# Patient Record
Sex: Male | Born: 1960 | Race: Black or African American | Hispanic: No | Marital: Single | Smoking: Former smoker
Health system: Southern US, Community
[De-identification: ages and names within clinical notes are randomized; demographics above are authoritative.]

## PROBLEM LIST (undated history)

## (undated) DIAGNOSIS — K922 Gastrointestinal hemorrhage, unspecified: Secondary | ICD-10-CM

## (undated) DIAGNOSIS — K219 Gastro-esophageal reflux disease without esophagitis: Secondary | ICD-10-CM

## (undated) DIAGNOSIS — Z5189 Encounter for other specified aftercare: Secondary | ICD-10-CM

## (undated) DIAGNOSIS — K7460 Unspecified cirrhosis of liver: Secondary | ICD-10-CM

## (undated) DIAGNOSIS — E119 Type 2 diabetes mellitus without complications: Secondary | ICD-10-CM

## (undated) DIAGNOSIS — B1920 Unspecified viral hepatitis C without hepatic coma: Secondary | ICD-10-CM

## (undated) HISTORY — PX: HERNIA REPAIR: SHX51

## (undated) HISTORY — PX: MANDIBLE SURGERY: SHX707

---

## 2009-07-07 ENCOUNTER — Emergency Department (HOSPITAL_COMMUNITY): Admission: EM | Admit: 2009-07-07 | Discharge: 2009-07-07 | Payer: Self-pay | Admitting: Emergency Medicine

## 2009-07-31 ENCOUNTER — Ambulatory Visit: Payer: Self-pay | Admitting: Gastroenterology

## 2009-07-31 ENCOUNTER — Inpatient Hospital Stay (HOSPITAL_COMMUNITY)
Admission: EM | Admit: 2009-07-31 | Discharge: 2009-08-02 | Payer: Self-pay | Source: Home / Self Care | Admitting: Emergency Medicine

## 2009-08-19 ENCOUNTER — Encounter (INDEPENDENT_AMBULATORY_CARE_PROVIDER_SITE_OTHER): Payer: Self-pay | Admitting: Emergency Medicine

## 2009-08-19 ENCOUNTER — Ambulatory Visit: Payer: Self-pay | Admitting: Vascular Surgery

## 2009-08-19 ENCOUNTER — Inpatient Hospital Stay (HOSPITAL_COMMUNITY)
Admission: EM | Admit: 2009-08-19 | Discharge: 2009-08-21 | Payer: Self-pay | Source: Home / Self Care | Admitting: Emergency Medicine

## 2009-09-28 ENCOUNTER — Emergency Department (HOSPITAL_COMMUNITY): Admission: EM | Admit: 2009-09-28 | Discharge: 2009-09-28 | Payer: Self-pay | Admitting: Emergency Medicine

## 2009-12-17 ENCOUNTER — Inpatient Hospital Stay (HOSPITAL_COMMUNITY)
Admission: EM | Admit: 2009-12-17 | Discharge: 2009-12-21 | Payer: Self-pay | Source: Home / Self Care | Admitting: Emergency Medicine

## 2009-12-19 ENCOUNTER — Ambulatory Visit: Payer: Self-pay | Admitting: Internal Medicine

## 2009-12-20 ENCOUNTER — Encounter: Payer: Self-pay | Admitting: Internal Medicine

## 2010-01-06 ENCOUNTER — Inpatient Hospital Stay (HOSPITAL_COMMUNITY)
Admission: EM | Admit: 2010-01-06 | Discharge: 2010-01-12 | Payer: Self-pay | Source: Home / Self Care | Attending: Internal Medicine | Admitting: Internal Medicine

## 2010-01-07 ENCOUNTER — Encounter: Payer: Self-pay | Admitting: Gastroenterology

## 2010-01-10 ENCOUNTER — Telehealth (INDEPENDENT_AMBULATORY_CARE_PROVIDER_SITE_OTHER): Payer: Self-pay | Admitting: *Deleted

## 2010-01-12 ENCOUNTER — Telehealth (INDEPENDENT_AMBULATORY_CARE_PROVIDER_SITE_OTHER): Payer: Self-pay

## 2010-01-12 ENCOUNTER — Encounter: Payer: Self-pay | Admitting: Gastroenterology

## 2010-01-25 ENCOUNTER — Ambulatory Visit (HOSPITAL_COMMUNITY)
Admission: RE | Admit: 2010-01-25 | Discharge: 2010-01-25 | Payer: Self-pay | Source: Home / Self Care | Attending: Gastroenterology | Admitting: Gastroenterology

## 2010-01-25 ENCOUNTER — Encounter: Payer: Self-pay | Admitting: Gastroenterology

## 2010-02-21 NOTE — Procedures (Signed)
Summary: Upper Endoscopy  Patient: Jesse Delacruz Note: All result statuses are Final unless otherwise noted.  Tests: (1) Upper Endoscopy (EGD)   EGD Upper Endoscopy       DONE     [facility]     [address]     [place], [place]  [address]           ENDOSCOPY PROCEDURE REPORT           PATIENT:  [name], [name]  MR#:  [id number]     BIRTHDATE:  [birth year], 49 yrs. old  GENDER:  male     ENDOSCOPIST:  Judie Petit T. Russella Dar, MD, [facility]     Referred by:  [facility],     PROCEDURE DATE:  07/31/2009     PROCEDURE:  EGD, diagnostic     ASA CLASS:  Class III     INDICATIONS:  hematemesis     MEDICATIONS:  Fentanyl 40 mcg IV, Versed 4 mg IV     TOPICAL ANESTHETIC:  Cetacaine Spray     DESCRIPTION OF PROCEDURE:   After the risks benefits and     alternatives of the procedure were thoroughly explained, informed     consent was obtained.  The Pentax EG-[id number] 3.2 [id number] endoscope     was introduced through the mouth and advanced to the second     portion of the duodenum, without limitations.  The instrument was     slowly withdrawn as the mucosa was fully examined.     <<PROCEDUREIMAGES>>     Gastropathy was found in the total stomach. It was erythematous,     granular and non-bleeding. The esophagus and gastroesophageal     junction were completely normal in appearance. No esophageal or     gastric varices. The duodenal bulb was normal in appearance, as     was the postbulbar duodenum.  Retroflexed views revealed     Retroflexion exam demonstrated findings as previously described.     The scope was then withdrawn from the patient and the procedure     completed.           COMPLICATIONS:  None           ENDOSCOPIC IMPRESSION:     1) Portal gastropathy           RECOMMENDATIONS:     1) consider nonselective B Blocker therapy to reduce portal     pressures when stablized     2) return to referring MD and PCP for ongoing medical care           Malcolm  T. Russella Dar, MD, [facility]           n.     eSIGNED:   Venita Lick. Stark at 07/31/2009 05:19 PM           Sheets, Lazy Y U, [id number]  Note: An exclamation mark (!) indicates a result that was not dispersed into the flowsheet. Document Creation Date: 08/01/2009 3:15 PM _______________________________________________________________________  (1) Order result status: Final Collection or observation date-time: 07/31/2009 16:42 Requested date-time:  Receipt date-time:  Reported date-time:  Referring Physician:   Ordering Physician: Claudette Head [phone]) Specimen Source:  Source: Launa Grill Order Number: [id number] Lab site:

## 2010-02-21 NOTE — Procedures (Signed)
Summary: Upper Endoscopy  Patient: Jesse Delacruz Note: All result statuses are Final unless otherwise noted.  Tests: (1) Upper Endoscopy (EGD)   EGD Upper Endoscopy       DONE     [facility]     [address]     [place], [place]  [address]           ENDOSCOPY PROCEDURE REPORT           PATIENT:  Jesse Delacruz, Jesse Delacruz  MR#:  [id number]     BIRTHDATE:  29-Nov-1960, 49 yrs. 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  The Pentax Gastroscope [id number] endoscope     was introduced through the mouth and advanced to the second     portion of the duodenum, without limitations.  The instrument was     slowly withdrawn as the mucosa was fully examined.     <<PROCEDUREIMAGES>>           Portal gastropathy in the total stomach. Severe in mid-stomach.     Otherwise the examination was normal. No varices seen in the     esophagus. Photos not captured.    Retroflexed views revealed     Retroflexion exam demonstrated findings as previously described.     The scope was then withdrawn from the patient and the procedure     completed.           COMPLICATIONS:  None           ENDOSCOPIC IMPRESSION:     1) Portal gastropathy in the total stomach     2) Otherwise normal examination - no esophageal varices     identified     RECOMMENDATIONS:     1) Change propranolol from 20  mg qd to Nadolol 20 mg qd.     2) Stop alcohol     3) Needs an MRI of liver in 6 mos due to 11 mm indeterminate     lesion (? hepatocellular carcinoma).     4) will check alpha fetoprotein, if elevated significantly then     can indicate HCC           Iva Boop, MD, [facility] Graham           CC:  Clyda Greener, MD           n.     eSIGNED:   Iva Boop at 12/20/2009 02:30 PM           [name],  [name], [name] [id number]  Note: An exclamation mark (!) indicates a result that was not dispersed into the flowsheet. Document Creation Date: 12/20/2009 2:30 PM _______________________________________________________________________  (1) Order result status: Final Collection or observation date-time: 12/20/2009 14:23 Requested date-time:  Receipt date-time:  Reported date-time:  Referring Physician:   Ordering Physician: Stan Head [phone]) Specimen Source:  Source: Launa Grill Order Number: [id number] Lab site:

## 2010-02-23 NOTE — Procedures (Signed)
Summary: Upper Endoscopy  Patient: [name] Note: All result statuses are Final unless otherwise noted.  Tests: (1) Upper Endoscopy (EGD)   EGD Upper Endoscopy       DONE     [facility]     [address] [place], [place]  [address]           ENDOSCOPY PROCEDURE REPORT           PATIENT:  Jesse Delacruz, Jesse Delacruz  MR#:  [id number]     BIRTHDATE:  02-Oct-1960, 49 yrs. old  GENDER:  male     ENDOSCOPIST:  Rachael Fee, MD     PROCEDURE DATE:  01/07/2010     PROCEDURE:  EGD with band ligation of varices     ASA CLASS:  Class IV     INDICATIONS:  melena, known cirrhosis; EGD 3-4 weeks ago found     varices, portal gastropathy     MEDICATIONS:   Fentanyl 75 mcg IV, Versed 6 mg IV     TOPICAL ANESTHETIC:  Cetacaine Spray     DESCRIPTION OF PROCEDURE:   After the risks benefits and     alternatives of the procedure were thoroughly explained, informed     consent was obtained.  The  endoscope was introduced through the     mouth and advanced to the second portion of the duodenum, without     limitations.  The instrument was slowly withdrawn as the mucosa     was fully examined.     <<PROCEDUREIMAGES>>     There were three trunks of medium sized distal esophagus varices.     No active bleeding and no redspots or fibrin clots on varices.     These were treated with placement of 6 ligating bands. There was     no blood in stomach. There was moderate changes of portal     gastropathy throughout stomach (see image2, image10, image5, and     image6).  Otherwise the examination was normal (see image4).     Retroflexed views revealed no abnormalities.    The scope was then     withdrawn from the patient and the procedure completed.     COMPLICATIONS:  None           ENDOSCOPIC IMPRESSION:     1) Medium sized esophageal varices, none bleeding. These were     treated with band ligation     2) Portal gastropathy.     2) Otherwise normal examination; no gastric varices         RECOMMENDATIONS:     Continue on octreotide gtts another 24 hours.     Observe clinically for rebleeding, DTs.     Clear liquids today.           ______________________________     Rachael Fee, MD           cc: Stan Head, MD           n.     eSIGNED:   Rachael Fee at 01/07/2010 09:42 AM           Miggins, Rella Larve, [id number]  Note: An exclamation mark (!) indicates a result that was not dispersed into the flowsheet. Document Creation Date: 01/09/2010 8:45 AM _______________________________________________________________________  (1) Order result status: Final Collection or observation date-time: 01/07/2010 09:36 Requested date-time:  Receipt date-time:  Reported date-time:  Referring Physician:   Ordering Physician: Rob Bunting [phone]) Specimen Source:  Source: Launa Grill Order Number: [id number] Lab site:

## 2010-02-23 NOTE — Letter (Signed)
Summary: EGD Instructions  [facility]  [address] [place], [place] [address]   Phone: [phone]  Fax: [phone]       [facility]    [birth year]    MRN: [id number]       Procedure Day Dorna Bloom: Wednesday 01-25-10       Arrival Time: 7:30 a.m.     Procedure Time: 8:30 a.m.     Location of Procedure:                     _x  _ [facility] ( Outpatient Registration)    PREPARATION FOR ENDOSCOPY   On 01/24/10 THE DAY OF THE PROCEDURE:  1.   No solid foods, milk or milk products are allowed after midnight the night before your procedure.  2.   Do not drink anything colored red or purple.  Avoid juices with pulp.  No orange juice.  3.  You may drink clear liquids until 4:30 a.m.  , which is 4 hours before your procedure.                                                                                                CLEAR LIQUIDS INCLUDE: Water Jello Ice Popsicles Tea (sugar ok, no milk/cream) Powdered fruit flavored drinks Coffee (sugar ok, no milk/cream) Gatorade Juice: apple, white grape, white cranberry  Lemonade Clear bullion, consomm, broth Carbonated beverages (any kind) Strained chicken noodle soup Hard Candy   MEDICATION INSTRUCTIONS  Unless otherwise instructed, you should take regular prescription medications with a small sip of water as early as possible the morning of your procedure.  Diabetic patients - see separate instructions.   Additional medication instructions: _             OTHER INSTRUCTIONS  You will need a responsible adult at least 51 years of age to accompany you and drive you home.   This person must remain in the waiting room during your procedure.  Wear loose fitting clothing that is easily removed.  Leave jewelry and other valuables at home.  However, you may wish to bring a book to read or an iPod/MP3 player to listen to music as you wait for your procedure to start.  Remove all body piercing  jewelry and leave at home.  Total time from sign-in until discharge is approximately 2-3 hours.  You should go home directly after your procedure and rest.  You can resume normal activities the day after your procedure.  The day of your procedure you should not:   Drive   Make legal decisions   Operate machinery   Drink alcohol   Return to work  You will receive specific instructions about eating, activities and medications before you leave.    The above instructions have been reviewed and explained to me by   _______________________    I fully understand and can verbalize these instructions _____________________________ Date _________

## 2010-02-23 NOTE — Progress Notes (Signed)
Summary: EGD  Phone Note Other Incoming   Caller: Amy Summary of Call: Dr Christella Hartigan  Amy called and would like you to do a EGD with banding at [facility] 01/26/10  you cc'd Dr Leone Payor on the last EGD should this go to Dr Leone Payor? Initial call taken by: Chales Abrahams CMA Duncan Dull),  January 10, 2010 10:52 AM  Follow-up for Phone Call        yes, saw patient last month in hosp. Follow-up by: Rachael Fee MD,  January 10, 2010 11:11 AM  Additional Follow-up for Phone Call Additional follow up Details #1::        left message on Amy's  machine to call back  Additional Follow-up by: Chales Abrahams CMA Duncan Dull),  January 10, 2010 11:21 AM    Additional Follow-up for Phone Call Additional follow up Details #2::    Amy aware Follow-up by: Chales Abrahams CMA Duncan Dull),  January 10, 2010 12:50 PM

## 2010-02-23 NOTE — Procedures (Signed)
Summary: Upper Endoscopy  Patient: Jesse Delacruz Note: All result statuses are Final unless otherwise noted.  Tests: (1) Upper Endoscopy (EGD)   EGD Upper Endoscopy       DONE     [facility]     [address] [place], [place]  [address]           ENDOSCOPY PROCEDURE REPORT           PATIENT:  Jesse, Delacruz  MR#:  [id number]     BIRTHDATE:  02/23/1960, 49 yrs. old  GENDER:  male     ENDOSCOPIST:  Judie Petit T. Russella Dar, MD, [facility]           PROCEDURE DATE:  01/25/2010     PROCEDURE:  EGD, diagnostic 43235     ASA CLASS:  Class III     INDICATIONS:  follow up of esophageal varices     MEDICATIONS:  Fentanyl 50 mcg IV, Versed 5 mg IV     TOPICAL ANESTHETIC:  Cetacaine Spray     DESCRIPTION OF PROCEDURE:   After the risks benefits and     alternatives of the procedure were thoroughly explained, informed     consent was obtained.  The EG-[id number] ([id number]) endoscope was     introduced through the mouth and advanced to the second portion of     the duodenum, without limitations.  The instrument was slowly     withdrawn as the mucosa was fully examined.     <<PROCEDUREIMAGES>>     Multiple ulcers were found in the distal esophagus at the sites of     prior bands. No varices noted.  Gastropathy was found in the total     stomach.  The duodenal bulb was normal in appearance, as was the     postbulbar duodenum.  Otherwise the examination was normal.     Retroflexed views revealed no abnormalities and no gastric varices     seen upon retroflexion into the cardia. The scope was then     withdrawn from the patient and the procedure completed.           COMPLICATIONS:  None           ENDOSCOPIC IMPRESSION:     1) Ulcers, multiple in the distal esophagus     2) Gastropathy           RECOMMENDATIONS:     1) EGD in one year     2) Follow up with PCP as planned           Malcolm T. Russella Dar, MD, [facility]           n.     eSIGNED:   Venita Lick. Stark at 01/25/2010 09:16 AM       Denardo, Rella Larve, [id number]  Note: An exclamation mark (!) indicates a result that was not dispersed into the flowsheet. Document Creation Date: 01/25/2010 9:16 AM _______________________________________________________________________  (1) Order result status: Final Collection or observation date-time: 01/25/2010 09:05 Requested date-time:  Receipt date-time:  Reported date-time:  Referring Physician:   Ordering Physician: Claudette Head [phone]) Specimen Source:  Source: Launa Grill Order Number: [id number] Lab site:   Appended Document: Upper Endoscopy    Clinical Lists Changes  Observations: Added new observation of EGD DUE: 01/2011 (01/25/2010 16:04)

## 2010-02-23 NOTE — Progress Notes (Signed)
Summary: Schedule EGD with Banding for Dr Russella Dar  Phone Note Other Incoming   Caller: Mike Gip, PA Summary of Call: Patient is inpatient and Mike Gip, PA asked me to set patient up for EGD with banding at [facility] .  He is scheduled for 01/25/10 8:30.  I have faxed instructions to [facility] PA and she will give them to the patient prior to discharge. Initial call taken by: Darcey Nora RN, CGRN,  January 12, 2010 9:59 AM  Follow-up for Phone Call        Above noted. Follow-up by: Meryl Dare MD Clementeen Graham,  January 12, 2010 10:03 AM

## 2010-04-03 LAB — COMPREHENSIVE METABOLIC PANEL
ALT: 57 U/L — ABNORMAL HIGH (ref 0–53)
AST: 121 U/L — ABNORMAL HIGH (ref 0–37)
Albumin: 2.1 g/dL — ABNORMAL LOW (ref 3.5–5.2)
Albumin: 2.1 g/dL — ABNORMAL LOW (ref 3.5–5.2)
Alkaline Phosphatase: 60 U/L (ref 39–117)
Alkaline Phosphatase: 60 U/L (ref 39–117)
BUN: 18 mg/dL (ref 6–23)
BUN: 5 mg/dL — ABNORMAL LOW (ref 6–23)
BUN: 7 mg/dL (ref 6–23)
CO2: 23 mEq/L (ref 19–32)
CO2: 25 mEq/L (ref 19–32)
Calcium: 8.5 mg/dL (ref 8.4–10.5)
Calcium: 8.5 mg/dL (ref 8.4–10.5)
Chloride: 101 mEq/L (ref 96–112)
Creatinine, Ser: 1.18 mg/dL (ref 0.4–1.5)
Creatinine, Ser: 1.25 mg/dL (ref 0.4–1.5)
GFR calc Af Amer: >60 mL/min (ref >60)
GFR calc Af Amer: >60 mL/min (ref >60)
GFR calc non Af Amer: >60 mL/min (ref >60)
GFR calc non Af Amer: >60 mL/min (ref >60)
Glucose, Bld: 106 mg/dL — ABNORMAL HIGH (ref 70–99)
Glucose, Bld: 122 mg/dL — ABNORMAL HIGH (ref 70–99)
Potassium: 3.4 mEq/L — ABNORMAL LOW (ref 3.5–5.1)
Sodium: 132 mEq/L — ABNORMAL LOW (ref 135–145)
Sodium: 139 mEq/L (ref 135–145)
Total Bilirubin: 2.6 mg/dL — ABNORMAL HIGH (ref 0.3–1.2)
Total Bilirubin: 3.1 mg/dL — ABNORMAL HIGH (ref 0.3–1.2)
Total Protein: 7.1 g/dL (ref 6.0–8.3)
Total Protein: 7.5 g/dL (ref 6.0–8.3)

## 2010-04-03 LAB — CBC
HCT: 25.2 % — ABNORMAL LOW (ref 39.0–52.0)
HCT: 28.4 % — ABNORMAL LOW (ref 39.0–52.0)
HCT: 31.7 % — ABNORMAL LOW (ref 39.0–52.0)
Hemoglobin: 9.3 g/dL — ABNORMAL LOW (ref 13.0–17.0)
Hemoglobin: 9.3 g/dL — ABNORMAL LOW (ref 13.0–17.0)
Hemoglobin: 9.7 g/dL — ABNORMAL LOW (ref 13.0–17.0)
MCH: 22.1 pg — ABNORMAL LOW (ref 26.0–34.0)
MCH: 23 pg — ABNORMAL LOW (ref 26.0–34.0)
MCH: 23.3 pg — ABNORMAL LOW (ref 26.0–34.0)
MCH: 23.6 pg — ABNORMAL LOW (ref 26.0–34.0)
MCHC: 29.8 g/dL — ABNORMAL LOW (ref 30.0–36.0)
MCHC: 30.4 g/dL (ref 30.0–36.0)
MCHC: 30.6 g/dL (ref 30.0–36.0)
MCHC: 30.6 g/dL (ref 30.0–36.0)
MCHC: 30.7 g/dL (ref 30.0–36.0)
MCV: 74.3 fL — ABNORMAL LOW (ref 78.0–100.0)
MCV: 74.6 fL — ABNORMAL LOW (ref 78.0–100.0)
MCV: 75.5 fL — ABNORMAL LOW (ref 78.0–100.0)
MCV: 76 fL — ABNORMAL LOW (ref 78.0–100.0)
MCV: 76.7 fL — ABNORMAL LOW (ref 78.0–100.0)
MCV: 76.8 fL — ABNORMAL LOW (ref 78.0–100.0)
Platelets: 46 10*3/uL — ABNORMAL LOW (ref 150–400)
Platelets: 47 10*3/uL — ABNORMAL LOW (ref 150–400)
Platelets: 48 10*3/uL — ABNORMAL LOW (ref 150–400)
Platelets: 58 10*3/uL — ABNORMAL LOW (ref 150–400)
RBC: 4.04 MIL/uL — ABNORMAL LOW (ref 4.22–5.81)
RDW: 20.8 % — ABNORMAL HIGH (ref 11.5–15.5)
RDW: 20.8 % — ABNORMAL HIGH (ref 11.5–15.5)
RDW: 20.9 % — ABNORMAL HIGH (ref 11.5–15.5)
RDW: 20.9 % — ABNORMAL HIGH (ref 11.5–15.5)
RDW: 21 % — ABNORMAL HIGH (ref 11.5–15.5)
RDW: 21 % — ABNORMAL HIGH (ref 11.5–15.5)
WBC: 4.2 10*3/uL (ref 4.0–10.5)
WBC: 4.9 10*3/uL (ref 4.0–10.5)
WBC: 5.1 10*3/uL (ref 4.0–10.5)

## 2010-04-03 LAB — TYPE AND SCREEN
Unit division: 0
Unit division: 0

## 2010-04-03 LAB — BASIC METABOLIC PANEL
BUN: 10 mg/dL (ref 6–23)
BUN: 13 mg/dL (ref 6–23)
BUN: 22 mg/dL (ref 6–23)
Calcium: 8.2 mg/dL — ABNORMAL LOW (ref 8.4–10.5)
Calcium: 8.6 mg/dL (ref 8.4–10.5)
Chloride: 103 mEq/L (ref 96–112)
Chloride: 106 mEq/L (ref 96–112)
Creatinine, Ser: 1.1 mg/dL (ref 0.4–1.5)
GFR calc non Af Amer: >60 mL/min (ref >60)
Glucose, Bld: 110 mg/dL — ABNORMAL HIGH (ref 70–99)
Glucose, Bld: 124 mg/dL — ABNORMAL HIGH (ref 70–99)
Potassium: 4.2 mEq/L (ref 3.5–5.1)
Sodium: 133 mEq/L — ABNORMAL LOW (ref 135–145)

## 2010-04-03 LAB — APTT: aPTT: 41 seconds — ABNORMAL HIGH (ref 24–37)

## 2010-04-03 LAB — DIFFERENTIAL
Basophils Relative: 1 % (ref 0–1)
Basophils Relative: 2 % — ABNORMAL HIGH (ref 0–1)
Eosinophils Absolute: 0.2 10*3/uL (ref 0.0–0.7)
Eosinophils Relative: 3 % (ref 0–5)
Monocytes Absolute: 1.7 10*3/uL — ABNORMAL HIGH (ref 0.1–1.0)
Monocytes Relative: 20 % — ABNORMAL HIGH (ref 3–12)
Neutro Abs: 4.6 10*3/uL (ref 1.7–7.7)
Neutrophils Relative %: 61 % (ref 43–77)

## 2010-04-03 LAB — RAPID URINE DRUG SCREEN, HOSP PERFORMED
Benzodiazepines: NOT DETECTED
Cocaine: NOT DETECTED
Opiates: NOT DETECTED
Tetrahydrocannabinol: NOT DETECTED

## 2010-04-03 LAB — PROTIME-INR
INR: 1.5 — ABNORMAL HIGH (ref 0.00–1.49)
INR: 1.51 — ABNORMAL HIGH (ref 0.00–1.49)
Prothrombin Time: 18.4 seconds — ABNORMAL HIGH (ref 11.6–15.2)

## 2010-04-03 LAB — AMMONIA
Ammonia: 58 umol/L — ABNORMAL HIGH (ref 11–35)
Ammonia: 67 umol/L — ABNORMAL HIGH (ref 11–35)

## 2010-04-03 LAB — HEPATIC FUNCTION PANEL
ALT: 55 U/L — ABNORMAL HIGH (ref 0–53)
Albumin: 2.2 g/dL — ABNORMAL LOW (ref 3.5–5.2)
Alkaline Phosphatase: 71 U/L (ref 39–117)
Indirect Bilirubin: 1.6 mg/dL — ABNORMAL HIGH (ref 0.3–0.9)
Total Protein: 7.8 g/dL (ref 6.0–8.3)

## 2010-04-03 LAB — MAGNESIUM
Magnesium: 1.9 mg/dL (ref 1.5–2.5)
Magnesium: 1.9 mg/dL (ref 1.5–2.5)

## 2010-04-03 LAB — PHOSPHORUS: Phosphorus: 3.3 mg/dL (ref 2.3–4.6)

## 2010-04-03 LAB — PREPARE RBC (CROSSMATCH)

## 2010-04-03 LAB — HEMOGLOBIN AND HEMATOCRIT, BLOOD: Hemoglobin: 9.8 g/dL — ABNORMAL LOW (ref 13.0–17.0)

## 2010-04-04 LAB — DIFFERENTIAL
Basophils Absolute: 0.1 10*3/uL (ref 0.0–0.1)
Basophils Relative: 2 % — ABNORMAL HIGH (ref 0–1)
Basophils Relative: 2 % — ABNORMAL HIGH (ref 0–1)
Eosinophils Relative: 1 % (ref 0–5)
Lymphocytes Relative: 15 % (ref 12–46)
Lymphocytes Relative: 18 % (ref 12–46)
Monocytes Absolute: 1.4 10*3/uL — ABNORMAL HIGH (ref 0.1–1.0)
Monocytes Relative: 19 % — ABNORMAL HIGH (ref 3–12)
Neutro Abs: 2.3 10*3/uL (ref 1.7–7.7)
Neutrophils Relative %: 60 % (ref 43–77)
Neutrophils Relative %: 63 % (ref 43–77)

## 2010-04-04 LAB — CBC
HCT: 28.9 % — ABNORMAL LOW (ref 39.0–52.0)
HCT: 35.7 % — ABNORMAL LOW (ref 39.0–52.0)
HCT: 36.4 % — ABNORMAL LOW (ref 39.0–52.0)
Hemoglobin: 10.3 g/dL — ABNORMAL LOW (ref 13.0–17.0)
Hemoglobin: 10.6 g/dL — ABNORMAL LOW (ref 13.0–17.0)
Hemoglobin: 11.2 g/dL — ABNORMAL LOW (ref 13.0–17.0)
Hemoglobin: 9.9 g/dL — ABNORMAL LOW (ref 13.0–17.0)
MCH: 20.2 pg — ABNORMAL LOW (ref 26.0–34.0)
MCH: 22.3 pg — ABNORMAL LOW (ref 26.0–34.0)
MCH: 22.3 pg — ABNORMAL LOW (ref 26.0–34.0)
MCH: 22.4 pg — ABNORMAL LOW (ref 26.0–34.0)
MCHC: 29.8 g/dL — ABNORMAL LOW (ref 30.0–36.0)
MCHC: 30 g/dL (ref 30.0–36.0)
MCHC: 30.2 g/dL (ref 30.0–36.0)
MCHC: 30.6 g/dL (ref 30.0–36.0)
MCHC: 30.7 g/dL (ref 30.0–36.0)
MCHC: 30.7 g/dL (ref 30.0–36.0)
MCV: 69.3 fL — ABNORMAL LOW (ref 78.0–100.0)
MCV: 70.6 fL — ABNORMAL LOW (ref 78.0–100.0)
MCV: 72.8 fL — ABNORMAL LOW (ref 78.0–100.0)
MCV: 73.2 fL — ABNORMAL LOW (ref 78.0–100.0)
Platelets: 103 10*3/uL — ABNORMAL LOW (ref 150–400)
Platelets: 60 10*3/uL — ABNORMAL LOW (ref 150–400)
Platelets: 65 10*3/uL — ABNORMAL LOW (ref 150–400)
RBC: 4.3 MIL/uL (ref 4.22–5.81)
RBC: 4.4 MIL/uL (ref 4.22–5.81)
RBC: 4.61 MIL/uL (ref 4.22–5.81)
RBC: 4.75 MIL/uL (ref 4.22–5.81)
RDW: 20.5 % — ABNORMAL HIGH (ref 11.5–15.5)
RDW: 20.7 % — ABNORMAL HIGH (ref 11.5–15.5)
RDW: 20.9 % — ABNORMAL HIGH (ref 11.5–15.5)
RDW: 20.9 % — ABNORMAL HIGH (ref 11.5–15.5)
RDW: 21.5 % — ABNORMAL HIGH (ref 11.5–15.5)
WBC: 3.6 10*3/uL — ABNORMAL LOW (ref 4.0–10.5)
WBC: 4.9 10*3/uL (ref 4.0–10.5)
WBC: 4.9 10*3/uL (ref 4.0–10.5)
WBC: 7.3 10*3/uL (ref 4.0–10.5)

## 2010-04-04 LAB — COMPREHENSIVE METABOLIC PANEL
ALT: 57 U/L — ABNORMAL HIGH (ref 0–53)
Albumin: 2.2 g/dL — ABNORMAL LOW (ref 3.5–5.2)
Alkaline Phosphatase: 58 U/L (ref 39–117)
Alkaline Phosphatase: 65 U/L (ref 39–117)
BUN: 11 mg/dL (ref 6–23)
BUN: 13 mg/dL (ref 6–23)
BUN: 7 mg/dL (ref 6–23)
CO2: 26 mEq/L (ref 19–32)
Calcium: 8.3 mg/dL — ABNORMAL LOW (ref 8.4–10.5)
Calcium: 8.4 mg/dL (ref 8.4–10.5)
Chloride: 110 mEq/L (ref 96–112)
Creatinine, Ser: 0.89 mg/dL (ref 0.4–1.5)
Creatinine, Ser: 1 mg/dL (ref 0.4–1.5)
GFR calc non Af Amer: >60 mL/min (ref >60)
Glucose, Bld: 111 mg/dL — ABNORMAL HIGH (ref 70–99)
Glucose, Bld: 123 mg/dL — ABNORMAL HIGH (ref 70–99)
Glucose, Bld: 95 mg/dL (ref 70–99)
Potassium: 3.5 mEq/L (ref 3.5–5.1)
Potassium: 4.2 mEq/L (ref 3.5–5.1)
Sodium: 138 mEq/L (ref 135–145)
Total Bilirubin: 2.6 mg/dL — ABNORMAL HIGH (ref 0.3–1.2)
Total Protein: 7.9 g/dL (ref 6.0–8.3)
Total Protein: 8.2 g/dL (ref 6.0–8.3)

## 2010-04-04 LAB — URINE CULTURE
Colony Count: >=100000
Culture  Setup Time: 201111271259

## 2010-04-04 LAB — GLUCOSE, CAPILLARY: Glucose-Capillary: 161 mg/dL — ABNORMAL HIGH (ref 70–99)

## 2010-04-04 LAB — TYPE AND SCREEN
ABO/RH(D): A POS
Antibody Screen: NEGATIVE
Unit division: 0
Unit division: 0

## 2010-04-04 LAB — BASIC METABOLIC PANEL
CO2: 26 mEq/L (ref 19–32)
Calcium: 8.8 mg/dL (ref 8.4–10.5)
GFR calc Af Amer: >60 mL/min (ref >60)
Potassium: 3.9 mEq/L (ref 3.5–5.1)
Sodium: 131 mEq/L — ABNORMAL LOW (ref 135–145)

## 2010-04-04 LAB — URINALYSIS, ROUTINE W REFLEX MICROSCOPIC
Nitrite: POSITIVE — AB
Protein, ur: NEGATIVE mg/dL
Specific Gravity, Urine: 1.017 (ref 1.005–1.030)
Urobilinogen, UA: 2 mg/dL — ABNORMAL HIGH (ref 0.0–1.0)

## 2010-04-04 LAB — PREPARE RBC (CROSSMATCH)

## 2010-04-04 LAB — LIPASE, BLOOD: Lipase: 42 U/L (ref 11–59)

## 2010-04-04 LAB — AFP TUMOR MARKER: AFP-Tumor Marker: 22.4 ng/mL — ABNORMAL HIGH (ref 0.0–8.0)

## 2010-04-04 LAB — PROTIME-INR: Prothrombin Time: 17.4 seconds — ABNORMAL HIGH (ref 11.6–15.2)

## 2010-04-04 LAB — MAGNESIUM
Magnesium: 1.7 mg/dL (ref 1.5–2.5)
Magnesium: 1.8 mg/dL (ref 1.5–2.5)

## 2010-04-04 LAB — URINE MICROSCOPIC-ADD ON

## 2010-04-06 LAB — POCT I-STAT, CHEM 8
BUN: 6 mg/dL (ref 6–23)
Calcium, Ion: 1.02 mmol/L — ABNORMAL LOW (ref 1.12–1.32)
Chloride: 104 mEq/L (ref 96–112)
HCT: 40 % (ref 39.0–52.0)
Sodium: 138 mEq/L (ref 135–145)

## 2010-04-06 LAB — POCT CARDIAC MARKERS
CKMB, poc: 1.7 ng/mL (ref 1.0–8.0)
CKMB, poc: <1 ng/mL — ABNORMAL LOW (ref 1.0–8.0)
Troponin i, poc: <0.05 ng/mL (ref 0.00–0.09)
Troponin i, poc: <0.05 ng/mL (ref 0.00–0.09)

## 2010-04-06 LAB — CBC
Hemoglobin: 10.3 g/dL — ABNORMAL LOW (ref 13.0–17.0)
Platelets: 87 10*3/uL — ABNORMAL LOW (ref 150–400)
RBC: 4.88 MIL/uL (ref 4.22–5.81)
WBC: 3.6 10*3/uL — ABNORMAL LOW (ref 4.0–10.5)

## 2010-04-06 LAB — DIFFERENTIAL
Basophils Absolute: 0.1 10*3/uL (ref 0.0–0.1)
Eosinophils Absolute: 0.1 10*3/uL (ref 0.0–0.7)
Lymphocytes Relative: 16 % (ref 12–46)
Monocytes Relative: 24 % — ABNORMAL HIGH (ref 3–12)
Neutro Abs: 1.9 10*3/uL (ref 1.7–7.7)
Neutrophils Relative %: 55 % (ref 43–77)
Smear Review: DECREASED

## 2010-04-06 LAB — D-DIMER, QUANTITATIVE: D-Dimer, Quant: 0.87 ug/mL-FEU — ABNORMAL HIGH (ref 0.00–0.48)

## 2010-04-08 LAB — DIFFERENTIAL
Blasts: 0 %
Lymphocytes Relative: 49 % — ABNORMAL HIGH (ref 12–46)
Lymphs Abs: 2.2 10*3/uL (ref 0.7–4.0)
Monocytes Relative: 16 % — ABNORMAL HIGH (ref 3–12)
Neutro Abs: 1.5 10*3/uL — ABNORMAL LOW (ref 1.7–7.7)
Neutrophils Relative %: 33 % — ABNORMAL LOW (ref 43–77)
Promyelocytes Absolute: 0 %
nRBC: 0 /100 WBC

## 2010-04-08 LAB — CBC
HCT: 32.8 % — ABNORMAL LOW (ref 39.0–52.0)
Hemoglobin: 10.2 g/dL — ABNORMAL LOW (ref 13.0–17.0)
Hemoglobin: 10.3 g/dL — ABNORMAL LOW (ref 13.0–17.0)
MCH: 22.2 pg — ABNORMAL LOW (ref 26.0–34.0)
MCH: 22.3 pg — ABNORMAL LOW (ref 26.0–34.0)
MCHC: 31.2 g/dL (ref 30.0–36.0)
MCHC: 31.4 g/dL (ref 30.0–36.0)
Platelets: 142 10*3/uL — ABNORMAL LOW (ref 150–400)
RBC: 4.33 MIL/uL (ref 4.22–5.81)
RDW: 24.9 % — ABNORMAL HIGH (ref 11.5–15.5)
RDW: 26.2 % — ABNORMAL HIGH (ref 11.5–15.5)
RDW: 26.5 % — ABNORMAL HIGH (ref 11.5–15.5)
WBC: 4.5 10*3/uL (ref 4.0–10.5)

## 2010-04-08 LAB — COMPREHENSIVE METABOLIC PANEL
AST: 174 U/L — ABNORMAL HIGH (ref 0–37)
BUN: 10 mg/dL (ref 6–23)
CO2: 25 mEq/L (ref 19–32)
CO2: 27 mEq/L (ref 19–32)
Calcium: 8.4 mg/dL (ref 8.4–10.5)
Calcium: 9.2 mg/dL (ref 8.4–10.5)
Chloride: 102 mEq/L (ref 96–112)
Creatinine, Ser: 0.99 mg/dL (ref 0.4–1.5)
Creatinine, Ser: 1 mg/dL (ref 0.4–1.5)
GFR calc Af Amer: >60 mL/min (ref >60)
GFR calc Af Amer: >60 mL/min (ref >60)
GFR calc non Af Amer: >60 mL/min (ref >60)
GFR calc non Af Amer: >60 mL/min (ref >60)
Glucose, Bld: 101 mg/dL — ABNORMAL HIGH (ref 70–99)
Glucose, Bld: 85 mg/dL (ref 70–99)
Total Bilirubin: 2.1 mg/dL — ABNORMAL HIGH (ref 0.3–1.2)
Total Protein: 8.7 g/dL — ABNORMAL HIGH (ref 6.0–8.3)

## 2010-04-08 LAB — URINALYSIS, ROUTINE W REFLEX MICROSCOPIC
Hgb urine dipstick: NEGATIVE
Nitrite: NEGATIVE
Specific Gravity, Urine: 1.015 (ref 1.005–1.030)
Urobilinogen, UA: 1 mg/dL (ref 0.0–1.0)
pH: 6 (ref 5.0–8.0)

## 2010-04-08 LAB — HEMOCCULT GUIAC POC 1CARD (OFFICE): Fecal Occult Bld: POSITIVE

## 2010-04-08 LAB — CROSSMATCH: ABO/RH(D): A POS

## 2010-04-08 LAB — HEMOGLOBIN AND HEMATOCRIT, BLOOD
Hemoglobin: 10.3 g/dL — ABNORMAL LOW (ref 13.0–17.0)
Hemoglobin: 10.3 g/dL — ABNORMAL LOW (ref 13.0–17.0)
Hemoglobin: 9.9 g/dL — ABNORMAL LOW (ref 13.0–17.0)

## 2010-04-08 LAB — POCT I-STAT, CHEM 8
BUN: 14 mg/dL (ref 6–23)
Calcium, Ion: 1.06 mmol/L — ABNORMAL LOW (ref 1.12–1.32)
Creatinine, Ser: 1.1 mg/dL (ref 0.4–1.5)
Glucose, Bld: 100 mg/dL — ABNORMAL HIGH (ref 70–99)
Sodium: 142 mEq/L (ref 135–145)
TCO2: 24 mmol/L (ref 0–100)

## 2010-04-08 LAB — HEMOGLOBIN: Hemoglobin: 9.6 g/dL — ABNORMAL LOW (ref 13.0–17.0)

## 2010-04-09 LAB — HEMOGLOBIN AND HEMATOCRIT, BLOOD
HCT: 27.8 % — ABNORMAL LOW (ref 39.0–52.0)
Hemoglobin: 10.5 g/dL — ABNORMAL LOW (ref 13.0–17.0)

## 2010-04-09 LAB — COMPREHENSIVE METABOLIC PANEL
ALT: 75 U/L — ABNORMAL HIGH (ref 0–53)
AST: 199 U/L — ABNORMAL HIGH (ref 0–37)
AST: 205 U/L — ABNORMAL HIGH (ref 0–37)
Albumin: 2.5 g/dL — ABNORMAL LOW (ref 3.5–5.2)
Albumin: 2.9 g/dL — ABNORMAL LOW (ref 3.5–5.2)
Alkaline Phosphatase: 65 U/L (ref 39–117)
Alkaline Phosphatase: 73 U/L (ref 39–117)
BUN: 7 mg/dL (ref 6–23)
CO2: 25 mEq/L (ref 19–32)
CO2: 27 mEq/L (ref 19–32)
Calcium: 9.2 mg/dL (ref 8.4–10.5)
Chloride: 102 mEq/L (ref 96–112)
Chloride: 105 mEq/L (ref 96–112)
Creatinine, Ser: 1 mg/dL (ref 0.4–1.5)
GFR calc Af Amer: >60 mL/min (ref >60)
GFR calc Af Amer: >60 mL/min (ref >60)
GFR calc non Af Amer: >60 mL/min (ref >60)
Glucose, Bld: 115 mg/dL — ABNORMAL HIGH (ref 70–99)
Potassium: 3.4 mEq/L — ABNORMAL LOW (ref 3.5–5.1)
Potassium: 3.8 mEq/L (ref 3.5–5.1)
Sodium: 137 mEq/L (ref 135–145)
Total Bilirubin: 2.2 mg/dL — ABNORMAL HIGH (ref 0.3–1.2)
Total Bilirubin: 2.7 mg/dL — ABNORMAL HIGH (ref 0.3–1.2)
Total Protein: 9.1 g/dL — ABNORMAL HIGH (ref 6.0–8.3)

## 2010-04-09 LAB — URINALYSIS, ROUTINE W REFLEX MICROSCOPIC
Bilirubin Urine: NEGATIVE
Glucose, UA: NEGATIVE mg/dL
Ketones, ur: NEGATIVE mg/dL
Leukocytes, UA: NEGATIVE
Nitrite: NEGATIVE
Protein, ur: NEGATIVE mg/dL
Specific Gravity, Urine: 1.003 — ABNORMAL LOW (ref 1.005–1.030)
Urobilinogen, UA: 0.2 mg/dL (ref 0.0–1.0)
pH: 5.5 (ref 5.0–8.0)

## 2010-04-09 LAB — CROSSMATCH
ABO/RH(D): A POS
Antibody Screen: NEGATIVE

## 2010-04-09 LAB — CBC
HCT: 32.9 % — ABNORMAL LOW (ref 39.0–52.0)
Hemoglobin: 10.1 g/dL — ABNORMAL LOW (ref 13.0–17.0)
Hemoglobin: 10.5 g/dL — ABNORMAL LOW (ref 13.0–17.0)
MCH: 20.9 pg — ABNORMAL LOW (ref 26.0–34.0)
MCHC: 30.6 g/dL (ref 30.0–36.0)
MCV: 68.1 fL — ABNORMAL LOW (ref 78.0–100.0)
MCV: 70.2 fL — ABNORMAL LOW (ref 78.0–100.0)
Platelets: 34 10*3/uL — ABNORMAL LOW (ref 150–400)
Platelets: 44 10*3/uL — ABNORMAL LOW (ref 150–400)
RBC: 4.83 MIL/uL (ref 4.22–5.81)
RBC: 4.86 MIL/uL (ref 4.22–5.81)
RDW: 22.4 % — ABNORMAL HIGH (ref 11.5–15.5)
WBC: 3.7 10*3/uL — ABNORMAL LOW (ref 4.0–10.5)
WBC: 4.6 10*3/uL (ref 4.0–10.5)

## 2010-04-09 LAB — RAPID URINE DRUG SCREEN, HOSP PERFORMED
Amphetamines: NOT DETECTED
Tetrahydrocannabinol: NOT DETECTED

## 2010-04-09 LAB — PROTIME-INR
INR: 1.4 (ref 0.00–1.49)
Prothrombin Time: 17 seconds — ABNORMAL HIGH (ref 11.6–15.2)

## 2010-04-09 LAB — ABO/RH: ABO/RH(D): A POS

## 2010-04-09 LAB — DIFFERENTIAL
Basophils Absolute: 0 10*3/uL (ref 0.0–0.1)
Basophils Relative: 0 % (ref 0–1)
Eosinophils Absolute: 0.2 10*3/uL (ref 0.0–0.7)
Eosinophils Relative: 5 % (ref 0–5)
Lymphocytes Relative: 38 % (ref 12–46)
Lymphs Abs: 1.7 10*3/uL (ref 0.7–4.0)
Monocytes Absolute: 0.5 10*3/uL (ref 0.1–1.0)
Monocytes Relative: 10 % (ref 3–12)
Neutro Abs: 2.2 10*3/uL (ref 1.7–7.7)
Neutrophils Relative %: 47 % (ref 43–77)

## 2010-04-09 LAB — APTT: aPTT: 37 seconds (ref 24–37)

## 2010-04-09 LAB — HEMOCCULT GUIAC POC 1CARD (OFFICE): Fecal Occult Bld: POSITIVE

## 2010-04-09 LAB — LIPASE, BLOOD: Lipase: 54 U/L (ref 11–59)

## 2010-04-09 LAB — URINE MICROSCOPIC-ADD ON

## 2010-04-09 LAB — ETHANOL: Alcohol, Ethyl (B): 294 mg/dL — ABNORMAL HIGH (ref 0–10)

## 2010-04-09 LAB — MRSA PCR SCREENING: MRSA by PCR: NEGATIVE

## 2010-05-08 ENCOUNTER — Emergency Department (HOSPITAL_COMMUNITY)
Admission: EM | Admit: 2010-05-08 | Discharge: 2010-05-09 | Disposition: A | Discharge status: Home / Self Care | Payer: Medicaid Other | Attending: Emergency Medicine | Admitting: Emergency Medicine

## 2010-05-08 DIAGNOSIS — M7989 Other specified soft tissue disorders: Secondary | ICD-10-CM | POA: Insufficient documentation

## 2010-05-08 DIAGNOSIS — Z8619 Personal history of other infectious and parasitic diseases: Secondary | ICD-10-CM | POA: Insufficient documentation

## 2010-05-08 DIAGNOSIS — R609 Edema, unspecified: Secondary | ICD-10-CM | POA: Insufficient documentation

## 2010-05-08 DIAGNOSIS — Z5900 Homelessness unspecified: Secondary | ICD-10-CM | POA: Insufficient documentation

## 2010-05-08 DIAGNOSIS — I8390 Asymptomatic varicose veins of unspecified lower extremity: Secondary | ICD-10-CM | POA: Insufficient documentation

## 2010-05-08 DIAGNOSIS — K7460 Unspecified cirrhosis of liver: Secondary | ICD-10-CM | POA: Insufficient documentation

## 2010-05-08 DIAGNOSIS — M79609 Pain in unspecified limb: Secondary | ICD-10-CM | POA: Insufficient documentation

## 2010-05-08 LAB — URINALYSIS, ROUTINE W REFLEX MICROSCOPIC
Bilirubin Urine: NEGATIVE
Glucose, UA: 100 mg/dL — AB
Specific Gravity, Urine: 1.018 (ref 1.005–1.030)
pH: 6 (ref 5.0–8.0)

## 2010-05-08 LAB — URINE MICROSCOPIC-ADD ON

## 2010-05-09 DIAGNOSIS — M7989 Other specified soft tissue disorders: Secondary | ICD-10-CM

## 2010-05-09 LAB — BASIC METABOLIC PANEL
BUN: 5 mg/dL — ABNORMAL LOW (ref 6–23)
GFR calc Af Amer: >60 mL/min (ref >60)
GFR calc non Af Amer: >60 mL/min (ref >60)
Potassium: 3.2 mEq/L — ABNORMAL LOW (ref 3.5–5.1)
Sodium: 141 mEq/L (ref 135–145)

## 2010-05-09 LAB — D-DIMER, QUANTITATIVE: D-Dimer, Quant: 1.34 ug/mL-FEU — ABNORMAL HIGH (ref 0.00–0.48)

## 2010-06-06 ENCOUNTER — Telehealth: Payer: Self-pay

## 2010-06-06 NOTE — Telephone Encounter (Signed)
I spoke with a family member the patient is out of town and not to return until some time next week.  I will call the patient back next week to schedule the patient a follow up MRI and AFP per Dr Russella Dar

## 2010-06-14 NOTE — Telephone Encounter (Signed)
I attempted to reach the patient again.  The person that answered states "I don't know where he is or when he plans to return from out of town.  She will let him know we are trying to reach him to set up MRI and AFP lab.  I have asked her to have him call when she sees him.

## 2010-06-18 NOTE — Telephone Encounter (Signed)
May need to send a letter outlining the recommended follow up and see if he contacts [place].

## 2010-06-20 NOTE — Telephone Encounter (Signed)
Letter sent.

## 2011-03-19 ENCOUNTER — Encounter: Payer: Self-pay | Admitting: Gastroenterology

## 2011-10-29 ENCOUNTER — Encounter: Payer: Self-pay | Admitting: Gastroenterology

## 2012-04-22 IMAGING — CR DG ABDOMEN ACUTE W/ 1V CHEST
3 series · 3 of 3 positions shown · non-contrast
Comparison: July 31, 2009

CLINICAL DATA: Bloody stools and right abdominal pain

ACUTE ABDOMEN SERIES (ABDOMEN 2 VIEW & CHEST 1 VIEW)

[w chest pa]
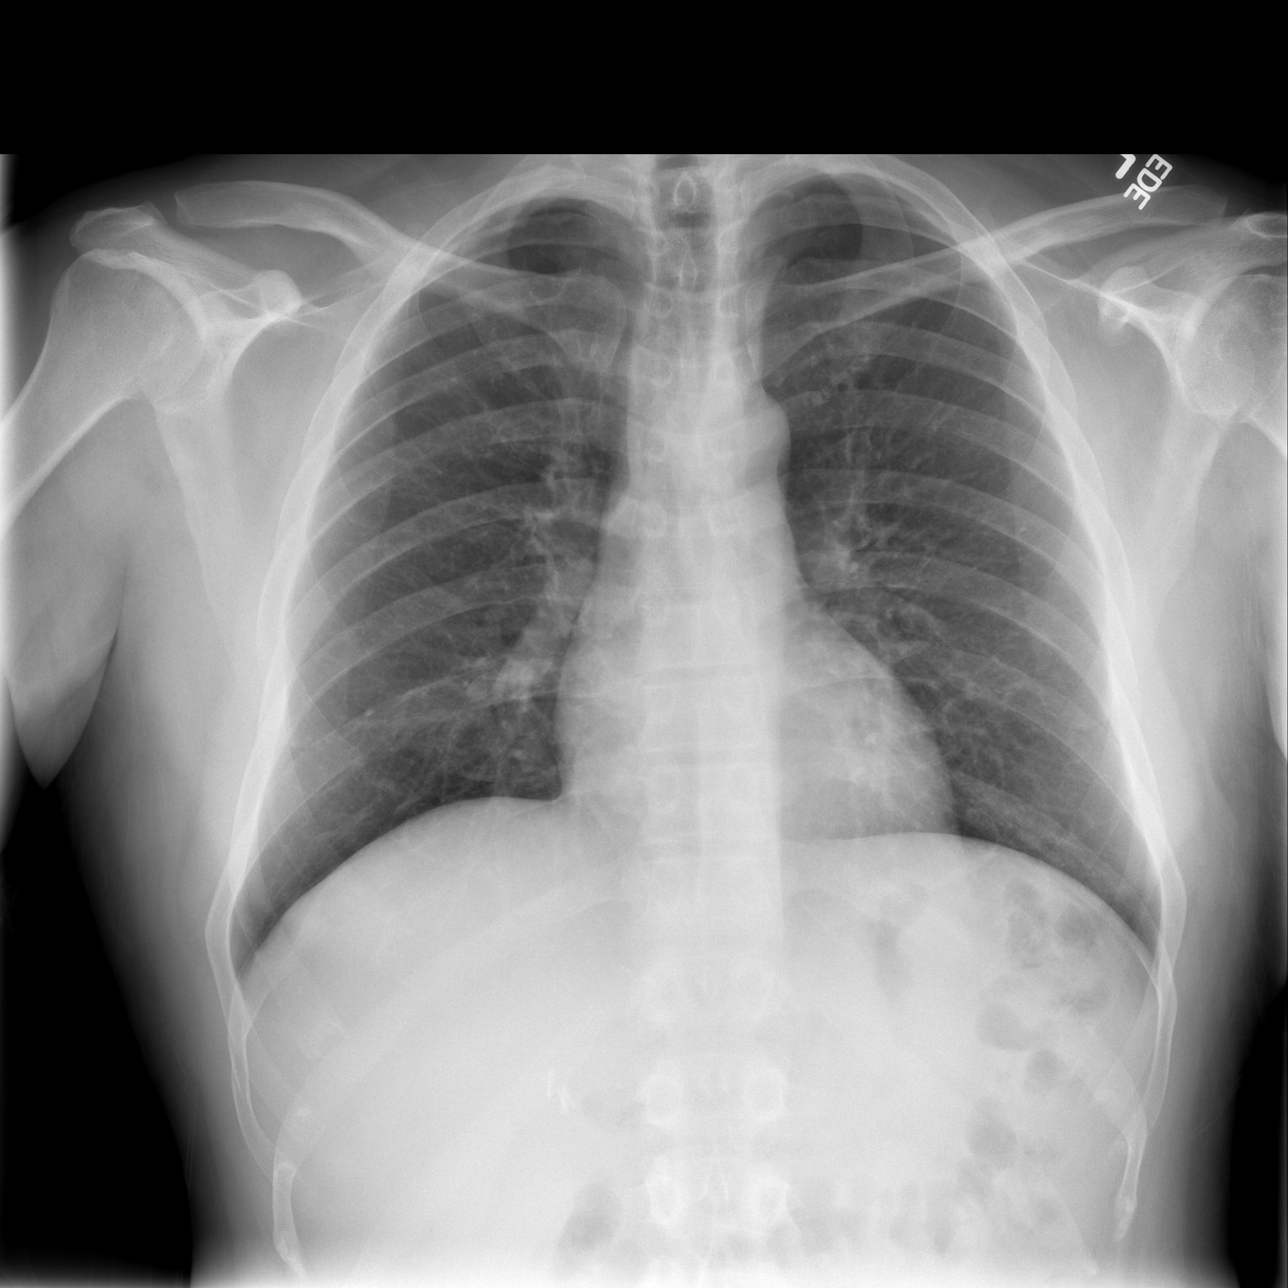

[w abdomen upright]
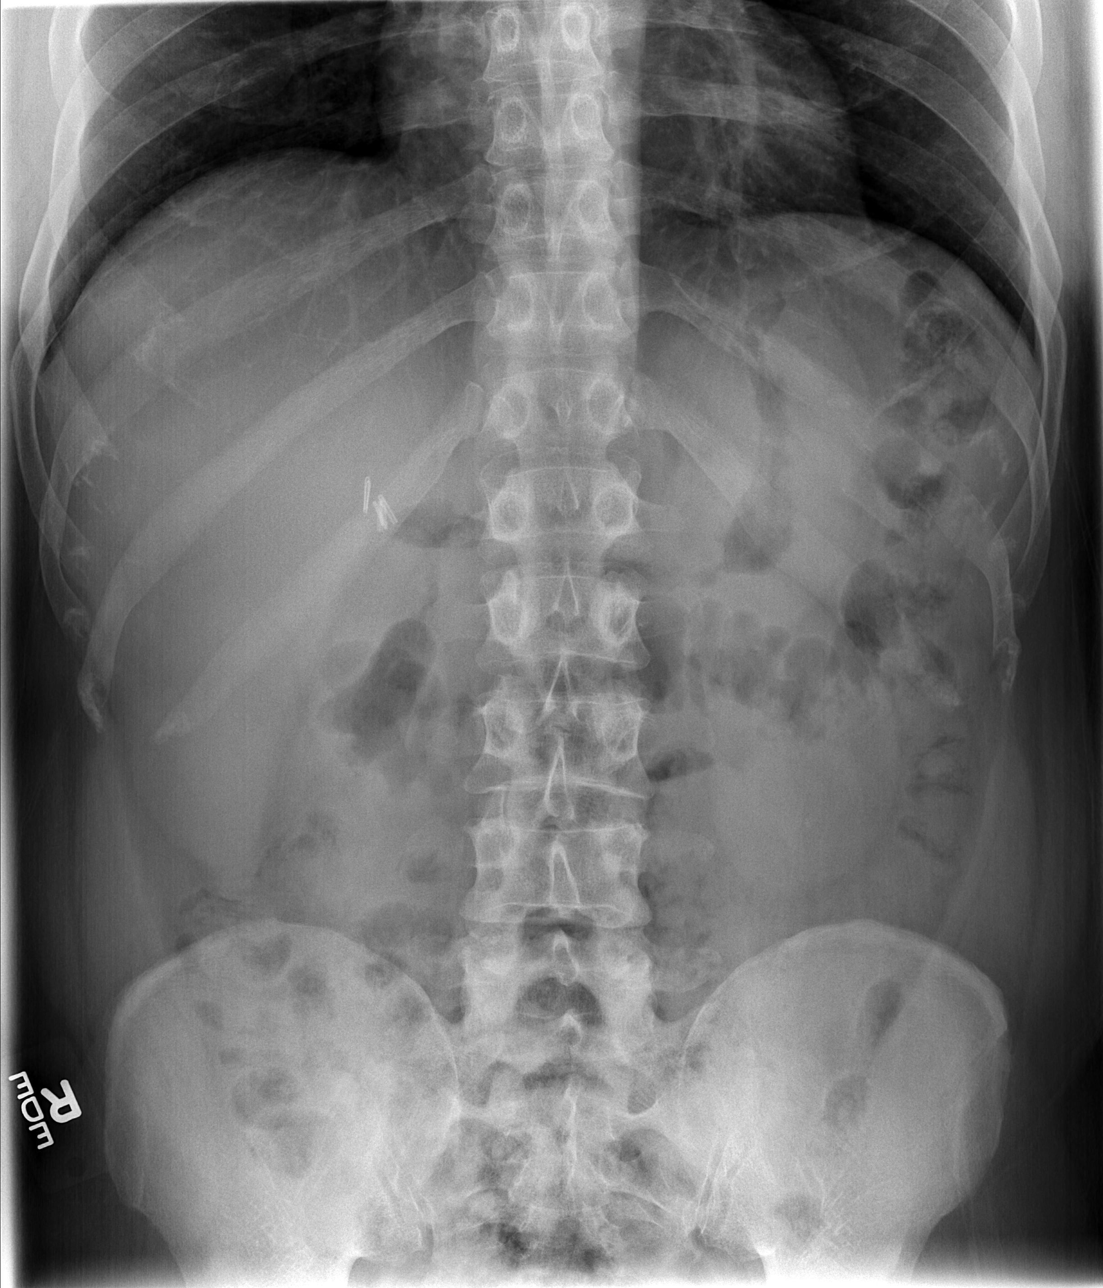

[t abdomen supine]
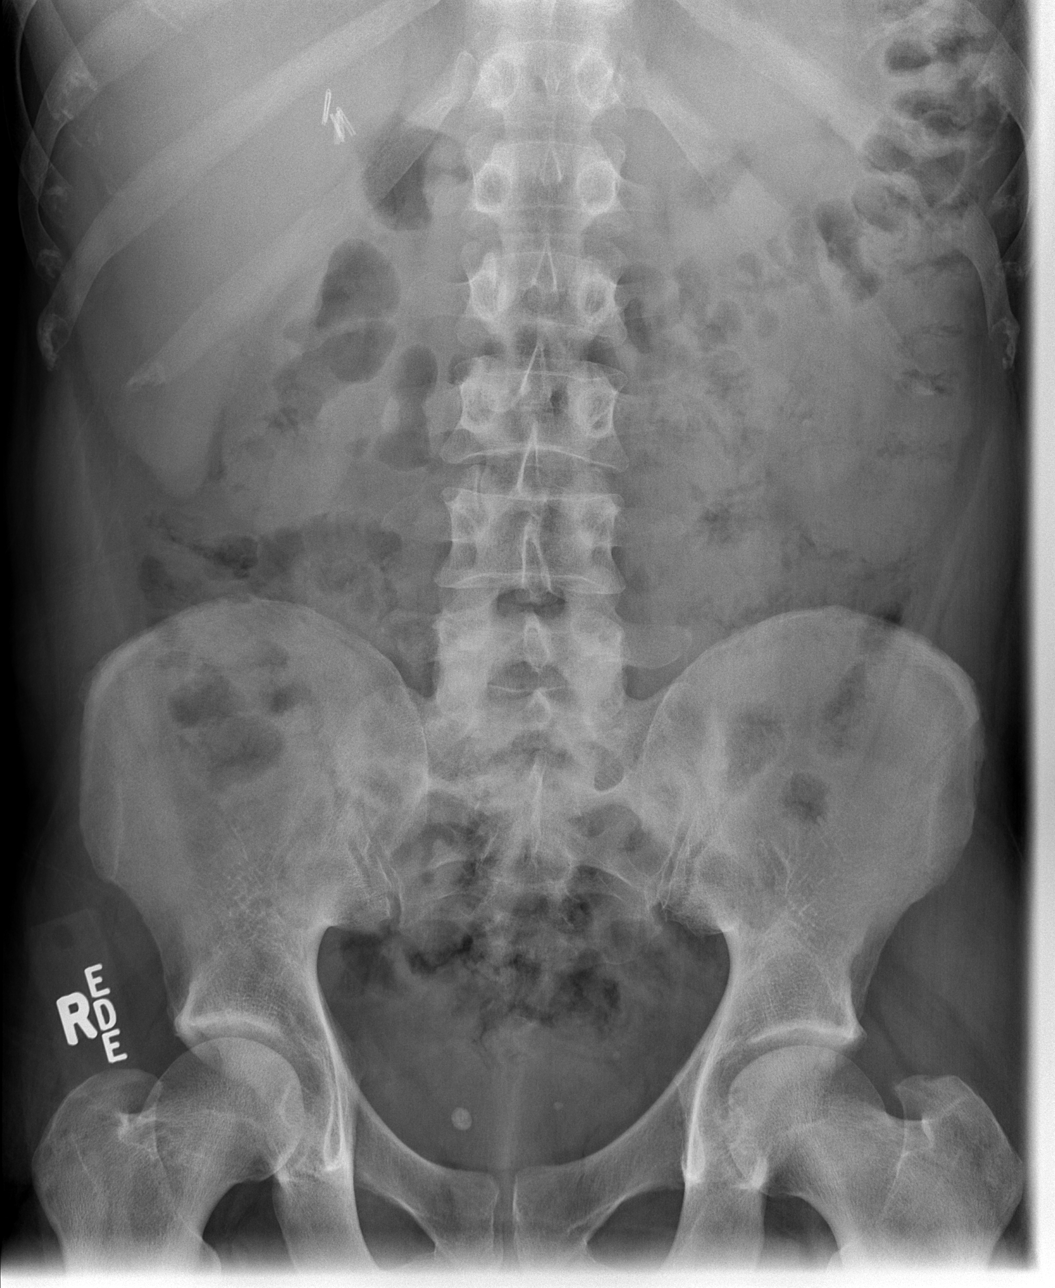

[3 of 3 positions shown; findings below may reference images not displayed]

FINDINGS: The cardiac silhouette, mediastinum, pulmonary
vasculature are within normal limits.  Both lungs are clear.
There is no acute bony abnormality.

No pneumoperitoneum.  The stool and bowel gas pattern is within
normal limits.  Surgical clips are noted over the gallbladder
fossa.  There is no gross organomegaly.  No abnormal calcifications
overlie the abdomen or pelvis.
IMPRESSION: Normal acute abdominal series.

## 2014-07-08 ENCOUNTER — Encounter (HOSPITAL_COMMUNITY): Payer: Self-pay

## 2014-07-08 ENCOUNTER — Emergency Department (HOSPITAL_COMMUNITY)
Admission: EM | Admit: 2014-07-08 | Discharge: 2014-07-08 | Disposition: A | Discharge status: Home / Self Care | Payer: Medicaid Other | Attending: Emergency Medicine | Admitting: Emergency Medicine

## 2014-07-08 DIAGNOSIS — R58 Hemorrhage, not elsewhere classified: Secondary | ICD-10-CM

## 2014-07-08 DIAGNOSIS — K219 Gastro-esophageal reflux disease without esophagitis: Secondary | ICD-10-CM | POA: Diagnosis not present

## 2014-07-08 DIAGNOSIS — Z8619 Personal history of other infectious and parasitic diseases: Secondary | ICD-10-CM | POA: Diagnosis not present

## 2014-07-08 DIAGNOSIS — I83892 Varicose veins of left lower extremities with other complications: Secondary | ICD-10-CM | POA: Diagnosis not present

## 2014-07-08 DIAGNOSIS — Z79899 Other long term (current) drug therapy: Secondary | ICD-10-CM | POA: Diagnosis not present

## 2014-07-08 DIAGNOSIS — Z87891 Personal history of nicotine dependence: Secondary | ICD-10-CM | POA: Insufficient documentation

## 2014-07-08 DIAGNOSIS — I8390 Asymptomatic varicose veins of unspecified lower extremity: Secondary | ICD-10-CM

## 2014-07-08 HISTORY — DX: Gastrointestinal hemorrhage, unspecified: K92.2

## 2014-07-08 HISTORY — DX: Unspecified viral hepatitis C without hepatic coma: B19.20

## 2014-07-08 HISTORY — DX: Encounter for other specified aftercare: Z51.89

## 2014-07-08 HISTORY — DX: Gastro-esophageal reflux disease without esophagitis: K21.9

## 2014-07-08 HISTORY — DX: Unspecified cirrhosis of liver: K74.60

## 2014-07-08 LAB — COMPREHENSIVE METABOLIC PANEL
ALBUMIN: 1.9 g/dL — AB (ref 3.5–5.0)
ALT: 61 U/L (ref 17–63)
AST: 114 U/L — AB (ref 15–41)
Alkaline Phosphatase: 182 U/L — ABNORMAL HIGH (ref 38–126)
Anion gap: 13 (ref 5–15)
BUN: 9 mg/dL (ref 6–20)
CALCIUM: 9.3 mg/dL (ref 8.9–10.3)
CHLORIDE: 88 mmol/L — AB (ref 101–111)
CO2: 20 mmol/L — ABNORMAL LOW (ref 22–32)
CREATININE: 1.09 mg/dL (ref 0.61–1.24)
GFR calc Af Amer: >60 mL/min (ref >60)
GFR calc non Af Amer: >60 mL/min (ref >60)
Glucose, Bld: 752 mg/dL (ref 65–99)
Potassium: 4.9 mmol/L (ref 3.5–5.1)
Sodium: 121 mmol/L — ABNORMAL LOW (ref 135–145)
TOTAL PROTEIN: 9 g/dL — AB (ref 6.5–8.1)
Total Bilirubin: 3.3 mg/dL — ABNORMAL HIGH (ref 0.3–1.2)

## 2014-07-08 LAB — CBC WITH DIFFERENTIAL/PLATELET
Basophils Absolute: 0 10*3/uL (ref 0.0–0.1)
Basophils Relative: 1 % (ref 0–1)
EOS PCT: 2 % (ref 0–5)
Eosinophils Absolute: 0.1 10*3/uL (ref 0.0–0.7)
HEMATOCRIT: 40.4 % (ref 39.0–52.0)
Hemoglobin: 13.4 g/dL (ref 13.0–17.0)
LYMPHS ABS: 0.3 10*3/uL — AB (ref 0.7–4.0)
LYMPHS PCT: 11 % — AB (ref 12–46)
MCH: 26.8 pg (ref 26.0–34.0)
MCHC: 33.2 g/dL (ref 30.0–36.0)
MCV: 80.8 fL (ref 78.0–100.0)
MONOS PCT: 23 % — AB (ref 3–12)
Monocytes Absolute: 0.7 10*3/uL (ref 0.1–1.0)
NEUTROS PCT: 63 % (ref 43–77)
Neutro Abs: 1.9 10*3/uL (ref 1.7–7.7)
PLATELETS: 83 10*3/uL — AB (ref 150–400)
RBC: 5 MIL/uL (ref 4.22–5.81)
RDW: 16.9 % — ABNORMAL HIGH (ref 11.5–15.5)
WBC: 3 10*3/uL — ABNORMAL LOW (ref 4.0–10.5)

## 2014-07-08 LAB — PROTIME-INR
INR: 1.54 — AB (ref 0.00–1.49)
PROTHROMBIN TIME: 18.5 s — AB (ref 11.6–15.2)

## 2014-07-08 LAB — CBG MONITORING, ED: GLUCOSE-CAPILLARY: 349 mg/dL — AB (ref 65–99)

## 2014-07-08 MED ORDER — INSULIN ASPART 100 UNIT/ML ~~LOC~~ SOLN
10.0000 [IU] | Freq: Once | SUBCUTANEOUS | Status: AC
  Administered 2014-07-08: 10 [IU] via INTRAVENOUS
  Filled 2014-07-08: qty 1

## 2014-07-08 MED ORDER — OXYCODONE HCL 5 MG PO TABS
5.0000 mg | ORAL_TABLET | Freq: Once | ORAL | Status: AC
  Administered 2014-07-08: 5 mg via ORAL
  Filled 2014-07-08: qty 1

## 2014-07-08 MED ORDER — LIDOCAINE-EPINEPHRINE 1 %-1:100000 IJ SOLN
20.0000 mL | Freq: Once | INTRAMUSCULAR | Status: DC
  Filled 2014-07-08: qty 1

## 2014-07-08 MED ORDER — SODIUM CHLORIDE 0.9 % IV BOLUS (SEPSIS)
1000.0000 mL | Freq: Once | INTRAVENOUS | Status: AC
  Administered 2014-07-08: 1000 mL via INTRAVENOUS

## 2014-07-08 NOTE — ED Notes (Signed)
Pt is in the bathroom at this time 

## 2014-07-08 NOTE — ED Notes (Signed)
Pt. Crying out in pain wanting medicine. Nurse notified.

## 2014-07-08 NOTE — ED Notes (Signed)
Pt. From [facility]. States he was pulling his pants up today when he "hit a spot on his leg and it started bleeding bad." Health care center wrapped pt. Leg in sheet and bleeding now controlled. 2/10 left leg pain.

## 2014-07-08 NOTE — ED Provider Notes (Signed)
CSN: [id number]     Arrival date & time 07/08/14  1509 History   First MD Initiated Contact with Patient 07/08/14 1524     Chief Complaint  Patient presents with  . leg bleeding      (Consider location/radiation/quality/duration/timing/severity/associated sxs/prior Treatment) Patient is a 54 y.o. male presenting with general illness.  Illness Location:  L LE Quality:  Bleeding varix Severity:  Moderate Onset quality:  Sudden Duration:  1 hour Timing:  Constant Progression:  Unchanged Chronicity:  New Context:  Known varicose veins, prior bleeds Relieved by:  Pressure Worsened by:  Nothing Associated symptoms: no abdominal pain, no chest pain, no fever and no sore throat     Past Medical History  Diagnosis Date  . Hepatitis C   . Cirrhosis   . GERD (gastroesophageal reflux disease)   . GI bleed   . Blood transfusion without reported diagnosis    Past Surgical History  Procedure Laterality Date  . Hernia repair    . Mandible surgery     No family history on file. History  Substance Use Topics  . Smoking status: Former Research scientist (life sciences)  . Smokeless tobacco: Not on file  . Alcohol Use: No    Review of Systems  Constitutional: Negative for fever.  HENT: Negative for sore throat.   Cardiovascular: Negative for chest pain.  Gastrointestinal: Negative for abdominal pain.  All other systems reviewed and are negative.     Allergies  Aspirin; Nsaids; and Shellfish allergy  Home Medications   Prior to Admission medications   Medication Sig Start Date End Date Taking? Authorizing Provider  docusate sodium (COLACE) 100 MG capsule Take 100 mg by mouth daily.   Yes Historical Provider, MD  ENSURE PLUS (ENSURE PLUS) LIQD Take 237 mLs by mouth daily.   Yes Historical Provider, MD  LACTULOSE PO Take 30 mLs by mouth 2 (two) times daily. Lactulose Solution 20GM/3ml   Yes Historical Provider, MD  LORazepam (ATIVAN) 1 MG tablet Take 1 mg by mouth at bedtime.   Yes Historical  Provider, MD  OxyCODONE HCl ER (OXYCONTIN) 30 MG T12A Take 1 tablet by mouth every 12 (twelve) hours.   Yes Historical Provider, MD  pantoprazole (PROTONIX) 20 MG tablet Take 20 mg by mouth daily.   Yes Historical Provider, MD  polyethylene glycol (MIRALAX / GLYCOLAX) packet Take 17 g by mouth daily.   Yes Historical Provider, MD  spironolactone (ALDACTONE) 25 MG tablet Take 25 mg by mouth 2 (two) times daily.   Yes Historical Provider, MD   BP 127/74 mmHg  Pulse 91  Temp(Src) 98.4 F (36.9 C) (Oral)  Resp 22  SpO2 95% Physical Exam  Constitutional: He is oriented to person, place, and time. He appears well-developed and well-nourished.  HENT:  Head: Normocephalic and atraumatic.  Eyes: Conjunctivae and EOM are normal.  Neck: Normal range of motion. Neck supple.  Cardiovascular: Normal rate, regular rhythm and normal heart sounds.   Pulmonary/Chest: Effort normal and breath sounds normal. No respiratory distress.  Abdominal: He exhibits no distension. There is no tenderness. There is no rebound and no guarding.  Musculoskeletal: Normal range of motion.  Neurological: He is alert and oriented to person, place, and time.  Skin: Skin is warm and dry.  Vitals reviewed.   ED Course  LACERATION REPAIR Date/Time: 07/09/2014 12:59 AM Performed by: Debby Freiberg Authorized by: Debby Freiberg Consent: Verbal consent obtained. Body area: lower extremity (L shin) Wound length (cm): punctate. Skin closure: glue Approximation: close Approximation  difficulty: simple Patient tolerance: Patient tolerated the procedure well with no immediate complications   (including critical care time) Labs Review Labs Reviewed  COMPREHENSIVE METABOLIC PANEL - Abnormal; Notable for the following:    Sodium 121 (*)    Chloride 88 (*)    CO2 20 (*)    Glucose, Bld 752 (*)    Total Protein 9.0 (*)    Albumin 1.9 (*)    AST 114 (*)    Alkaline Phosphatase 182 (*)    Total Bilirubin 3.3 (*)    All  other components within normal limits  CBC WITH DIFFERENTIAL/PLATELET - Abnormal; Notable for the following:    WBC 3.0 (*)    RDW 16.9 (*)    Platelets 83 (*)    Lymphocytes Relative 11 (*)    Lymphs Abs 0.3 (*)    Monocytes Relative 23 (*)    All other components within normal limits  PROTIME-INR - Abnormal; Notable for the following:    Prothrombin Time 18.5 (*)    INR 1.54 (*)    All other components within normal limits  CBG MONITORING, ED - Abnormal; Notable for the following:    Glucose-Capillary >600 (*)    All other components within normal limits  CBG MONITORING, ED - Abnormal; Notable for the following:    Glucose-Capillary 349 (*)    All other components within normal limits    Imaging Review No results found.   EKG Interpretation None      MDM   Final diagnoses:  Hemorrhage  Varicose vein of leg    54 y.o. male with pertinent PMH of end stage cirrhosis, hepatitis C on home hospice presents with bleeding leg varix. Mostly hemostatic on my examination, however small puncture with small amount of bleeding.  Applied dermabond.  Bleeding controlled.  DC home in stable condition   I have reviewed all laboratory and imaging studies if ordered as above  1. Hemorrhage   2. Varicose vein of leg         Debby Freiberg, MD 07/09/14 [phone]

## 2014-07-08 NOTE — ED Notes (Signed)
Attempted report x1. 

## 2014-07-08 NOTE — ED Notes (Signed)
Pt discharged with all belongings, questions answered. Pt wheeled out with family.

## 2014-07-08 NOTE — ED Notes (Signed)
Pt ambulated well from the bathroom back to the room; pt already in gown, on continuous pulse oximetry and blood pressure cuff

## 2014-07-08 NOTE — ED Notes (Signed)
Pt given a warm blanket; Colin Rhein, MD in with pt at this time

## 2014-07-08 NOTE — Discharge Instructions (Signed)
Bleeding Varicose Veins Varicose veins are veins that have become enlarged and twisted. Valves in the veins help return blood from the leg to the heart. If these valves are damaged, blood flows backwards and backs up into the veins in the leg near the skin. This causes the veins to become larger because of increased pressure within. Sometimes these veins bleed. CAUSES  Factors that can lead to bleeding varicose veins include:  Thinning of the skin that covers the veins. This skin is stretched as the veins enlarge.  Weak and thinning walls of the varicose veins. These thin walls are part of the reason why blood is not flowing normally to the heart.  Having high pressure in the veins. This high pressure occurs because the blood is not flowing freely back up to the heart.  Injury. Even a small injury to a varicose vein can cause bleeding.  Open wounds. A sore may develop near a varicose vein and not heal. This makes bleeding more likely.  Taking medicine that thins the blood. These medicines may include aspirin, anti-inflammatory medicine, and other blood thinners. SYMPTOMS  If bleeding is on the outside surface of the skin, blood can be seen. Sometimes, the bleeding stays under the skin. If this happens, the blue or purple area will spread beyond the vein. This discoloration may be visible. DIAGNOSIS  To decide if you have a bleeding varicose vein, your caregiver may:  Ask about your symptoms. This will include when you first saw bleeding.  Ask about how long you have had varicose veins and if they cause you problems.  Ask about your overall health.  Ask about possible causes, like recent cuts or if the area near the varicose veins was bumped or injured.  Examine the skin or leg that concerns you. Your caregiver will probably feel the veins.  Order imaging tests. These create detailed pictures of the veins. TREATMENT  The first goal of treating bleeding varicose veins is to stop the  bleeding. Then, the aim is to keep any bleeding from happening again. Treatment will depend on the cause of the bleeding and how bad it is. Ask your caregiver about what would be best for you. Options include:  Raising (elevating) your leg. Lie down with your leg propped up on a pillow or cushion. Your foot should be above your heart.  Applying pressure to the spot that is bleeding. The bleeding should stop in a short time.  Wearing elastic stocking that "compress" your legs (compression stockings). An elastic bandage may do the same thing.  Applying an antibiotic cream on sores that are not healing.  Surgically removing or closing off the bleeding varicose veins. HOME CARE INSTRUCTIONS   Apply any creams that your caregiver prescribed. Follow the directions carefully.  Wear compression stockings or any special wraps that were prescribed. Make sure you know:  If you should wear them every day.  How long you should wear them.  If veins were removed or closed, a bandage (dressing) will probably cover the area. Make sure you know:  How often the dressing should be changed.  Whether the area can get wet.  When you can leave the skin uncovered.  Check your skin every day. Look for new sores and signs of bleeding.  To prevent future bleeding:  Use extra care in situations where you could cut your legs. Shaving, for example, or working outside in the garden.  Try to keep your legs elevated as much as possible. Lie down  when you can. SEEK MEDICAL CARE IF:   You have any questions about how to wear compression stockings or elastic bandages.  Your veins continue to bleed.  Sores develop near your varicose veins.  You have a sore that does not heal or gets bigger.  Pain increases in your leg.  The area around a varicose vein becomes warm, red, or tender to the touch.  You notice a yellowish fluid that smells bad coming from a spot where there was bleeding.  You develop a  fever of more than 100.5 F (38.1 C). SEEK IMMEDIATE MEDICAL CARE IF:   You develop a fever of more than 102 F (38.9 C). Document Released: 05/27/2008 Document Revised: 04/02/2011 Document Reviewed: 05/12/2013 [facility] Patient Information 2015 [place], [place]. This information is not intended to replace advice given to you by your health care provider. Make sure you discuss any questions you have with your health care provider.

## 2014-07-21 ENCOUNTER — Other Ambulatory Visit: Payer: Self-pay | Admitting: Internal Medicine

## 2014-07-21 DIAGNOSIS — K769 Liver disease, unspecified: Secondary | ICD-10-CM

## 2014-08-02 ENCOUNTER — Other Ambulatory Visit: Payer: Medicaid Other

## 2014-11-12 ENCOUNTER — Emergency Department (HOSPITAL_COMMUNITY)

## 2014-11-12 ENCOUNTER — Encounter (HOSPITAL_COMMUNITY): Payer: Self-pay | Admitting: Emergency Medicine

## 2014-11-12 ENCOUNTER — Inpatient Hospital Stay (HOSPITAL_COMMUNITY)
Admission: EM | Admit: 2014-11-12 | Discharge: 2014-11-15 | DRG: 433 | Disposition: A | Discharge status: Skilled Nursing Facility | Attending: Internal Medicine | Admitting: Internal Medicine

## 2014-11-12 DIAGNOSIS — M7989 Other specified soft tissue disorders: Secondary | ICD-10-CM | POA: Diagnosis not present

## 2014-11-12 DIAGNOSIS — R609 Edema, unspecified: Secondary | ICD-10-CM

## 2014-11-12 DIAGNOSIS — Z66 Do not resuscitate: Secondary | ICD-10-CM | POA: Diagnosis present

## 2014-11-12 DIAGNOSIS — K219 Gastro-esophageal reflux disease without esophagitis: Secondary | ICD-10-CM | POA: Diagnosis present

## 2014-11-12 DIAGNOSIS — Z515 Encounter for palliative care: Secondary | ICD-10-CM | POA: Diagnosis present

## 2014-11-12 DIAGNOSIS — Z91013 Allergy to seafood: Secondary | ICD-10-CM

## 2014-11-12 DIAGNOSIS — Z6823 Body mass index (BMI) 23.0-23.9, adult: Secondary | ICD-10-CM

## 2014-11-12 DIAGNOSIS — R601 Generalized edema: Secondary | ICD-10-CM | POA: Diagnosis present

## 2014-11-12 DIAGNOSIS — E119 Type 2 diabetes mellitus without complications: Secondary | ICD-10-CM | POA: Diagnosis not present

## 2014-11-12 DIAGNOSIS — K7030 Alcoholic cirrhosis of liver without ascites: Secondary | ICD-10-CM | POA: Diagnosis not present

## 2014-11-12 DIAGNOSIS — R39198 Other difficulties with micturition: Secondary | ICD-10-CM | POA: Diagnosis present

## 2014-11-12 DIAGNOSIS — Z886 Allergy status to analgesic agent status: Secondary | ICD-10-CM

## 2014-11-12 DIAGNOSIS — Z87891 Personal history of nicotine dependence: Secondary | ICD-10-CM

## 2014-11-12 DIAGNOSIS — C220 Liver cell carcinoma: Secondary | ICD-10-CM | POA: Diagnosis present

## 2014-11-12 DIAGNOSIS — R339 Retention of urine, unspecified: Secondary | ICD-10-CM | POA: Diagnosis present

## 2014-11-12 DIAGNOSIS — E440 Moderate protein-calorie malnutrition: Secondary | ICD-10-CM | POA: Diagnosis present

## 2014-11-12 DIAGNOSIS — R160 Hepatomegaly, not elsewhere classified: Secondary | ICD-10-CM

## 2014-11-12 DIAGNOSIS — Z79899 Other long term (current) drug therapy: Secondary | ICD-10-CM

## 2014-11-12 DIAGNOSIS — T502X5A Adverse effect of carbonic-anhydrase inhibitors, benzothiadiazides and other diuretics, initial encounter: Secondary | ICD-10-CM | POA: Diagnosis present

## 2014-11-12 DIAGNOSIS — Z79891 Long term (current) use of opiate analgesic: Secondary | ICD-10-CM

## 2014-11-12 DIAGNOSIS — E43 Unspecified severe protein-calorie malnutrition: Secondary | ICD-10-CM | POA: Diagnosis present

## 2014-11-12 DIAGNOSIS — K7290 Hepatic failure, unspecified without coma: Secondary | ICD-10-CM | POA: Diagnosis present

## 2014-11-12 DIAGNOSIS — B1920 Unspecified viral hepatitis C without hepatic coma: Secondary | ICD-10-CM | POA: Diagnosis present

## 2014-11-12 DIAGNOSIS — K7460 Unspecified cirrhosis of liver: Secondary | ICD-10-CM

## 2014-11-12 DIAGNOSIS — E876 Hypokalemia: Secondary | ICD-10-CM | POA: Diagnosis present

## 2014-11-12 DIAGNOSIS — Z8249 Family history of ischemic heart disease and other diseases of the circulatory system: Secondary | ICD-10-CM

## 2014-11-12 HISTORY — DX: Type 2 diabetes mellitus without complications: E11.9

## 2014-11-12 LAB — CBC WITH DIFFERENTIAL/PLATELET
BASOS PCT: 1 %
Basophils Absolute: 0 10*3/uL (ref 0.0–0.1)
EOS ABS: 0.1 10*3/uL (ref 0.0–0.7)
Eosinophils Relative: 2 %
HEMATOCRIT: 37.7 % — AB (ref 39.0–52.0)
Hemoglobin: 12.5 g/dL — ABNORMAL LOW (ref 13.0–17.0)
LYMPHS ABS: 0.5 10*3/uL — AB (ref 0.7–4.0)
Lymphocytes Relative: 11 %
MCH: 28.2 pg (ref 26.0–34.0)
MCHC: 33.2 g/dL (ref 30.0–36.0)
MCV: 84.9 fL (ref 78.0–100.0)
MONO ABS: 1 10*3/uL (ref 0.1–1.0)
MONOS PCT: 21 %
NEUTROS ABS: 3.1 10*3/uL (ref 1.7–7.7)
Neutrophils Relative %: 65 %
PLATELETS: 164 10*3/uL (ref 150–400)
RBC: 4.44 MIL/uL (ref 4.22–5.81)
RDW: 16.1 % — ABNORMAL HIGH (ref 11.5–15.5)
WBC: 4.7 10*3/uL (ref 4.0–10.5)

## 2014-11-12 LAB — PROTIME-INR
INR: 1.55 — AB (ref 0.00–1.49)
Prothrombin Time: 18.7 seconds — ABNORMAL HIGH (ref 11.6–15.2)

## 2014-11-12 LAB — COMPREHENSIVE METABOLIC PANEL
ALT: 46 U/L (ref 17–63)
AST: 121 U/L — AB (ref 15–41)
Albumin: 2.1 g/dL — ABNORMAL LOW (ref 3.5–5.0)
Alkaline Phosphatase: 196 U/L — ABNORMAL HIGH (ref 38–126)
Anion gap: 9 (ref 5–15)
BUN: 8 mg/dL (ref 6–20)
CHLORIDE: 98 mmol/L — AB (ref 101–111)
CO2: 30 mmol/L (ref 22–32)
Calcium: 8.9 mg/dL (ref 8.9–10.3)
Creatinine, Ser: 1.11 mg/dL (ref 0.61–1.24)
Glucose, Bld: 149 mg/dL — ABNORMAL HIGH (ref 65–99)
POTASSIUM: 2.9 mmol/L — AB (ref 3.5–5.1)
Sodium: 137 mmol/L (ref 135–145)
Total Bilirubin: 3.3 mg/dL — ABNORMAL HIGH (ref 0.3–1.2)
Total Protein: 9.1 g/dL — ABNORMAL HIGH (ref 6.5–8.1)

## 2014-11-12 LAB — AMMONIA: AMMONIA: 81 umol/L — AB (ref 9–35)

## 2014-11-12 LAB — D-DIMER, QUANTITATIVE (NOT AT ARMC): D DIMER QUANT: 2.24 ug{FEU}/mL — AB (ref 0.00–0.48)

## 2014-11-12 MED ORDER — IOHEXOL 300 MG/ML  SOLN
25.0000 mL | Freq: Once | INTRAMUSCULAR | Status: AC | PRN
  Administered 2014-11-12: 25 mL via ORAL

## 2014-11-12 MED ORDER — POTASSIUM CHLORIDE 10 MEQ/100ML IV SOLN
10.0000 meq | Freq: Once | INTRAVENOUS | Status: AC
  Administered 2014-11-12: 10 meq via INTRAVENOUS
  Filled 2014-11-12: qty 100

## 2014-11-12 MED ORDER — IOHEXOL 300 MG/ML  SOLN
100.0000 mL | Freq: Once | INTRAMUSCULAR | Status: AC | PRN
  Administered 2014-11-12: 100 mL via INTRAVENOUS

## 2014-11-12 NOTE — ED Provider Notes (Signed)
CSN: [id number]     Arrival date & time 11/12/14  1850 History   First MD Initiated Contact with Patient 11/12/14 1918     Chief Complaint  Patient presents with  . Leg Swelling  . Urinary Retention   HPI Patient has history of end-stage cirrhosis and hepatitis C. Patient has had bilateral leg swelling as well as an inability to urinate. The onset of the symptoms are unclear. The patient's unable to tell me when this specifically started. The patient also had decreased urine output. Staff at the facility attempted to place a Foley catheter without success. The patient was sent in for further evaluation. He denies any complaints of pain. Denies any cough. No fevers chills. No vomiting or diarrhea  Past Medical History  Diagnosis Date  . Hepatitis C   . Cirrhosis ([place])   . GERD (gastroesophageal reflux disease)   . GI bleed   . Blood transfusion without reported diagnosis    Past Surgical History  Procedure Laterality Date  . Hernia repair    . Mandible surgery     No family history on file. Social History  Substance Use Topics  . Smoking status: Former Research scientist (life sciences)  . Smokeless tobacco: None  . Alcohol Use: No    Review of Systems  All other systems reviewed and are negative.     Allergies  Aspirin; Nsaids; and Shellfish allergy  Home Medications   Prior to Admission medications   Medication Sig Start Date End Date Taking? Authorizing Provider  baclofen (LIORESAL) 10 MG tablet Take 5 mg by mouth 3 (three) times daily.   Yes Historical Provider, MD  chlorhexidine (PERIDEX) 0.12 % solution Use as directed 30 mLs in the mouth or throat 3 (three) times daily. After brushing teeth   Yes Historical Provider, MD  furosemide (LASIX) 10 MG/ML injection Inject 40 mg into the muscle every 6 (six) hours.   Yes Historical Provider, MD  furosemide (LASIX) 40 MG tablet Take 40 mg by mouth daily.   Yes Historical Provider, MD  gabapentin (NEURONTIN) 300 MG capsule Take 300 mg by mouth 3  (three) times daily.   Yes Historical Provider, MD  insulin detemir (LEVEMIR) 100 UNIT/ML injection Inject 30 Units into the skin at bedtime.   Yes Historical Provider, MD  lactulose (CHRONULAC) 10 GM/15ML solution Take 20 g by mouth 2 (two) times daily.   Yes Historical Provider, MD  LORazepam (ATIVAN) 1 MG tablet Take 0.5 mg by mouth every 4 (four) hours as needed for anxiety.    Yes Historical Provider, MD  OxyCODONE (OXYCONTIN) 20 mg T12A 12 hr tablet Take 20 mg by mouth every 12 (twelve) hours. Take with 30 mg Oxycontin   Yes Historical Provider, MD  oxyCODONE (ROXICODONE) 15 MG immediate release tablet Take 15 mg by mouth every 4 (four) hours as needed for pain.   Yes Historical Provider, MD  OxyCODONE HCl ER (OXYCONTIN) 30 MG T12A Take 1 tablet by mouth every 12 (twelve) hours. Take with 20 mg Oxycontin ER   Yes Historical Provider, MD  pantoprazole (PROTONIX) 20 MG tablet Take 20 mg by mouth daily.   Yes Historical Provider, MD  senna (SENOKOT) 8.6 MG TABS tablet Take 2 tablets by mouth 2 (two) times daily.   Yes Historical Provider, MD  spironolactone (ALDACTONE) 50 MG tablet Take 50 mg by mouth 2 (two) times daily.   Yes Historical Provider, MD  docusate sodium (COLACE) 100 MG capsule Take 100 mg by mouth daily.  Historical Provider, MD  ENSURE PLUS (ENSURE PLUS) LIQD Take 237 mLs by mouth daily.    Historical Provider, MD   BP 131/84 mmHg  Pulse 101  Temp(Src) 98 F (36.7 C) (Oral)  Resp 18  Ht 5\' 8"  (1.727 m)  Wt 155 lb (70.308 kg)  BMI 23.57 kg/m2  SpO2 100% Physical Exam  Constitutional: No distress.  HENT:  Head: Normocephalic and atraumatic.  Right Ear: External ear normal.  Left Ear: External ear normal.  Eyes: Conjunctivae are normal. Right eye exhibits no discharge. Left eye exhibits no discharge. No scleral icterus.  Neck: Neck supple. No tracheal deviation present.  Cardiovascular: Normal rate, regular rhythm and intact distal pulses.   Pulmonary/Chest: Effort  normal and breath sounds normal. No stridor. No respiratory distress. He has no wheezes. He has no rales.  Abdominal: Soft. Bowel sounds are normal. He exhibits distension. There is tenderness in the suprapubic area. There is no rebound and no guarding.  Distended lower abdomen, ?blader, TTP  Musculoskeletal: He exhibits no edema or tenderness.  Neurological: He is alert. He has normal strength. No cranial nerve deficit (no facial droop, extraocular movements intact, no slurred speech) or sensory deficit. He exhibits normal muscle tone. He displays no seizure activity. Coordination normal.  Skin: Skin is warm and dry. No rash noted. He is not diaphoretic.  Psychiatric: He has a normal mood and affect.  Nursing note and vitals reviewed.   ED Course  Procedures (including critical care time) Labs Review Labs Reviewed  CBC WITH DIFFERENTIAL/PLATELET - Abnormal; Notable for the following:    Hemoglobin 12.5 (*)    HCT 37.7 (*)    RDW 16.1 (*)    Lymphs Abs 0.5 (*)    All other components within normal limits  D-DIMER, QUANTITATIVE (NOT AT [facility]) - Abnormal; Notable for the following:    D-Dimer, Quant 2.24 (*)    All other components within normal limits  PROTIME-INR - Abnormal; Notable for the following:    Prothrombin Time 18.7 (*)    INR 1.55 (*)    All other components within normal limits  AMMONIA - Abnormal; Notable for the following:    Ammonia 81 (*)    All other components within normal limits  COMPREHENSIVE METABOLIC PANEL - Abnormal; Notable for the following:    Potassium 2.9 (*)    Chloride 98 (*)    Glucose, Bld 149 (*)    Total Protein 9.1 (*)    Albumin 2.1 (*)    AST 121 (*)    Alkaline Phosphatase 196 (*)    Total Bilirubin 3.3 (*)    All other components within normal limits    Imaging Review Dg Chest 2 View  11/12/2014  CLINICAL DATA:  Leg swelling, abdominal distention, and upper chest tightness. EXAM: CHEST  2 VIEW COMPARISON:  01/07/2010 FINDINGS: Lung  volumes are low. The cardiomediastinal contours are unchanged with heart at the upper limits normal in size. There is vascular congestion without overt pulmonary edema. A retrocardiac hiatal hernia is seen. No confluent airspace disease, pleural effusion or pneumothorax. No acute osseous abnormalities are seen. IMPRESSION: 1. Stable borderline cardiomegaly. Vascular congestion without overt edema. 2. Hiatal hernia. Electronically Signed   By: Jeb Levering M.D.   On: 11/12/2014 19:56   Ct Abdomen Pelvis W Contrast  11/12/2014  CLINICAL DATA:  Acute onset of bilateral leg swelling. Difficulty urinating. Initial encounter. EXAM: CT ABDOMEN AND PELVIS WITH CONTRAST TECHNIQUE: Multidetector CT imaging of the abdomen and  pelvis was performed using the standard protocol following bolus administration of intravenous contrast. CONTRAST:  12mL OMNIPAQUE IOHEXOL 300 MG/ML  SOLN COMPARISON:  MRI of the abdomen performed 12/20/2009 FINDINGS: The visualized lung bases are clear. 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 Smaller splenic varices are noted. There is a 6.0 cm mildly heterogeneous hyperattenuating mass at the inferior tip of the liver. There is a 4.6 cm mildly hypoattenuating mass more superiorly within the right hepatic lobe. There is a vague 5.8 cm region of decreased attenuation within the right hepatic dome. Numerous scattered cystic foci are seen about the hepatic hilum and within the left hepatic lobe. There is also a smaller mildly hyperattenuating 2.1 cm mass in the periphery of the right hepatic lobe. These are all new from 2011, and most of the masses could reflect malignancy. Dynamic liver protocol MRI would be helpful for further evaluation, if malignancy is not yet diagnosed. The spleen is enlarged, measuring 15.5 cm in length. Changes of hepatic cirrhosis are again noted. 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 Trace ascites is noted tracking  about the liver and along the paracolic gutters. The patient is status post cholecystectomy, with clips noted at the gallbladder fossa. The pancreas and adrenal glands are grossly unremarkable in appearance. The liver and spleen are unremarkable in appearance. The gallbladder is within normal limits. The pancreas and adrenal glands are unremarkable. The kidneys are unremarkable in appearance. There is no evidence of hydronephrosis. No renal or ureteral stones are seen. No perinephric stranding is appreciated. The small bowel is unremarkable in appearance. The stomach is within normal limits. No acute vascular abnormalities are seen. The appendix is grossly unremarkable in appearance, though difficult to fully characterize. There is no evidence for appendicitis. The colon is partially filled with stool and is unremarkable in appearance. Diffuse hypervascularity is noted about the sigmoid colon and rectum. The bladder is mildly distended and grossly unremarkable. The prostate remains normal in size. Vague soft tissue density at the right inguinal region is thought to be postoperative in nature, though an incompletely descended testis could have a similar appearance. No inguinal lymphadenopathy is seen. No acute osseous abnormalities are identified. IMPRESSION: 1. Multiple masses noted within the liver, both hyperattenuating and hypoattenuating in appearance, measuring up to 6.0 cm. Each of the larger masses could reflect malignancy, and is new from 2011. Dynamic liver protocol MRI would be helpful for further evaluation, if malignancy has not yet been diagnosed. 2. Underlying hepatic cirrhosis, with trace ascites. 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 Smaller splenic varices noted. 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 3. Splenomegaly noted. 4. Diffuse hypervascularity noted about the sigmoid colon and rectum, of uncertain significance. 5. Vague soft tissue density at the  right inguinal region is thought to be postoperative in nature, though an incompletely descended testis could have a similar appearance. Electronically Signed   By: Garald Balding M.D.   On: 11/12/2014 23:16   I have personally reviewed and evaluated these images and lab results as part of my medical decision-making.   EKG Interpretation   Date/Time:  Friday November 12 2014 20:04:44 EDT Ventricular Rate:  99 PR Interval:  171 QRS Duration: 72 QT Interval:  336 QTC Calculation: 431 R Axis:   9 Text Interpretation:  Sinus rhythm Atrial premature complex Consider right  atrial enlargement Probable anterior infarct, age indeterminate Artifact  in lead(s) I II III aVR aVL  aVF V1 V2 V3 and baseline wander in lead(s) V4  No significant change since last tracing Confirmed by Winfred Leeds  MD, SAM  [phone]) on 11/12/2014 8:12:33 PM      MDM   Final diagnoses:  Cirrhosis of liver without ascites, unspecified hepatic cirrhosis type ([place])  Peripheral edema  Hypokalemia  Liver masses    Patient's labs show hypokalemia elevated INR and pneumonia level. His elevated LFTs consistent with his cirrhosis. CT scan shows liver masses as well as underlying hepatic cirrhosis. No evidence of acute intestinal obstruction or mass. Discussed the findings family. Patient does have history of liver cancer. The patient is currently on hospice care.  The patient's peripheral edema was most likely related to his liver disease however he has elevated d-dimer. I think the patient warrants a vascular ultrasound to exclude venous thrombosis. Unfortunately, that is not available at this time. Plan on vasc US in the am. I will consult the medical service for admission and palliative treatment.    Dorie Rank, MD 11/12/14 [phone]

## 2014-11-12 NOTE — ED Notes (Signed)
Patient transported to CT 

## 2014-11-12 NOTE — ED Notes (Signed)
Bed: TD97 Expected date:  Expected time:  Means of arrival:  Comments: EMS- 3s M, BLE edema/urinary retention

## 2014-11-12 NOTE — H&P (Addendum)
[facility] History and Physical  Jesse Delacruz DJM:[id number] DOB: 31-Aug-1960 DOA: 11/12/2014  Referring physician: ED physician PCP: No primary care provider on file.  Specialists:   Chief Complaint:  Bilateral leg swelling, difficulty urination  HPI: Jesse Delacruz is a 54 y.o. male with PMH of diabetes mellitus, GERD, HCV, liver cirrhosis, GI bleeding, hospice care, liver mass, who presents with bilateral leg swelling, difficulty urination.  Patient reports that he has chroic bilateral leg edema which has been progressively getting worse in the past several days. She states that recently he did not take Lasix until one week ago. Currently he is getting Lasix IM injection 40 mg every 6 hours, and also takes Lasix orally 40 mg daily. He does not have chest pain, shortness of breath, cough, abdominal pain, diarrhea, nausea, vomiting. He reports that he has difficulty urinating today, but no symptoms of UTI. Bladder scan was performed  In ED, which did not show urinary retention. Patient's sister states that he sometimes becomes confused, but not today. He is currently taking lactulose 20 g bid.  In ED, patient was found to have ammonia level 81, d-dimer 2.24, albumin 2.1, INR 1.55, tachycardia, temperature normal, potassium 2.9. Renal function normal. Patient isn't admitted to inpatient for further evaluation and treatment.  CT-abd/pelvis showed multiple masses noted within the liver, both hyperattenuating and hypoattenuating in appearance, measuring up to 6.0 cm. Each of the larger masses could reflect malignancy, and is new from 2011; underlying hepatic cirrhosis, with trace ascites. 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 Smaller splenic varices noted. 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 Splenomegaly noted. Diffuse hypervascularity noted about the sigmoid colon and rectum, of uncertain significance. Vague soft tissue density at the right  inguinal region is thought to be postoperative in nature, though an incompletely descended testis could have a similar appearance.  Where does patient live?   SNF  Can patient participate in ADLs?    None   Review of Systems:   General: no fevers, chills, no changes in body weight, has poor appetite, has fatigue HEENT: no blurry vision, hearing changes or sore throat Pulm: no dyspnea, coughing, wheezing CV: no chest pain, palpitations Abd: no nausea, vomiting, abdominal pain, diarrhea, constipation.  GU: no dysuria, burning on urination, increased urinary frequency, hematuria  Ext: has bilterally leg edema Neuro: no unilateral weakness, numbness, or tingling, no vision change or hearing loss Skin: no rash MSK: No muscle spasm, no deformity, no limitation of range of movement in spin Heme: No easy bruising.  Travel history: No recent long distant travel.  Allergy:  Allergies  Allergen Reactions  . Aspirin     unknown  . Nsaids     GI bleed  . Shellfish Allergy     unknown    Past Medical History  Diagnosis Date  . Hepatitis C   . Cirrhosis ([place])   . GERD (gastroesophageal reflux disease)   . GI bleed   . Blood transfusion without reported diagnosis   . Diabetes mellitus without complication [facility])     Past Surgical History  Procedure Laterality Date  . Hernia repair    . Mandible surgery      Social History:  reports that he has quit smoking. He does not have any smokeless tobacco history on file. He reports that he does not drink alcohol or use illicit drugs.  Family History:  Family History  Problem Relation Age of Onset  . Hyperlipidemia Sister   .  Hypertension Sister   . Hypertension Brother      Prior to Admission medications   Medication Sig Start Date End Date Taking? Authorizing Provider  baclofen (LIORESAL) 10 MG tablet Take 5 mg by mouth 3 (three) times daily.   Yes Historical Provider, MD  chlorhexidine (PERIDEX) 0.12 % solution Use as directed 30  mLs in the mouth or throat 3 (three) times daily. After brushing teeth   Yes Historical Provider, MD  furosemide (LASIX) 10 MG/ML injection Inject 40 mg into the muscle every 6 (six) hours.   Yes Historical Provider, MD  furosemide (LASIX) 40 MG tablet Take 40 mg by mouth daily.   Yes Historical Provider, MD  gabapentin (NEURONTIN) 300 MG capsule Take 300 mg by mouth 3 (three) times daily.   Yes Historical Provider, MD  insulin detemir (LEVEMIR) 100 UNIT/ML injection Inject 30 Units into the skin at bedtime.   Yes Historical Provider, MD  lactulose (CHRONULAC) 10 GM/15ML solution Take 20 g by mouth 2 (two) times daily.   Yes Historical Provider, MD  LORazepam (ATIVAN) 1 MG tablet Take 0.5 mg by mouth every 4 (four) hours as needed for anxiety.    Yes Historical Provider, MD  OxyCODONE (OXYCONTIN) 20 mg T12A 12 hr tablet Take 20 mg by mouth every 12 (twelve) hours. Take with 30 mg Oxycontin   Yes Historical Provider, MD  oxyCODONE (ROXICODONE) 15 MG immediate release tablet Take 15 mg by mouth every 4 (four) hours as needed for pain.   Yes Historical Provider, MD  OxyCODONE HCl ER (OXYCONTIN) 30 MG T12A Take 1 tablet by mouth every 12 (twelve) hours. Take with 20 mg Oxycontin ER   Yes Historical Provider, MD  pantoprazole (PROTONIX) 20 MG tablet Take 20 mg by mouth daily.   Yes Historical Provider, MD  senna (SENOKOT) 8.6 MG TABS tablet Take 2 tablets by mouth 2 (two) times daily.   Yes Historical Provider, MD  spironolactone (ALDACTONE) 50 MG tablet Take 50 mg by mouth 2 (two) times daily.   Yes Historical Provider, MD  docusate sodium (COLACE) 100 MG capsule Take 100 mg by mouth daily.    Historical Provider, MD  ENSURE PLUS (ENSURE PLUS) LIQD Take 237 mLs by mouth daily.    Historical Provider, MD    Physical Exam: Filed Vitals:   11/12/14 2116 11/13/14 0041 11/13/14 0113 11/13/14 0150  BP: 131/84 127/78 131/88 108/51  Pulse: 101 100 101 101  Temp:  98.4 F (36.9 C) 98.2 F (36.8 C) 98.2 F  (36.8 C)  TempSrc:  Oral Oral   Resp: 18 18 20    Height:      Weight:      SpO2: 100% 97% 99% 100%   General: Not in acute distress HEENT:       Eyes: PERRL, EOMI, no scleral icterus.       ENT: No discharge from the ears and nose, no pharynx injection, no tonsillar enlargement.        Neck: No JVD, no bruit, no mass felt. Heme: No neck lymph node enlargement. Cardiac: S1/S2, RRR, No murmurs, No gallops or rubs. Pulm: No rales, wheezing, rhonchi or rubs. Abd: Soft, distended, mild tenderness over RUQ, no rebound pain, has heptosplenomegaly, BS present. Ext: No pitting leg edema bilaterally. 2+DP/PT pulse bilaterally. Musculoskeletal: No joint deformities, No joint redness or warmth, no limitation of ROM in spin. Skin: No rashes.  Neuro: Alert, oriented X3, cranial nerves II-XII grossly intact, muscle strength 5/5 in all extremities, sensation  to light touch intact.  Psych: Patient is not psychotic, no suicidal or hemocidal ideation.  Labs on Admission:  Basic Metabolic Panel:  Recent Labs Lab 11/12/14 2003  NA 137  K 2.9*  CL 98*  CO2 30  GLUCOSE 149*  BUN 8  CREATININE 1.11  CALCIUM 8.9   Liver Function Tests:  Recent Labs Lab 11/12/14 2003  AST 121*  ALT 46  ALKPHOS 196*  BILITOT 3.3*  PROT 9.1*  ALBUMIN 2.1*   No results for input(s): LIPASE, AMYLASE in the last 168 hours.  Recent Labs Lab 11/12/14 2003  AMMONIA 81*   CBC:  Recent Labs Lab 11/12/14 2003  WBC 4.7  NEUTROABS 3.1  HGB 12.5*  HCT 37.7*  MCV 84.9  PLT 164   Cardiac Enzymes: No results for input(s): CKTOTAL, CKMB, CKMBINDEX, TROPONINI in the last 168 hours.  BNP (last 3 results) No results for input(s): BNP in the last 8760 hours.  ProBNP (last 3 results) No results for input(s): PROBNP in the last 8760 hours.  CBG: No results for input(s): GLUCAP in the last 168 hours.  Radiological Exams on Admission: Dg Chest 2 View  11/12/2014  CLINICAL DATA:  Leg swelling,  abdominal distention, and upper chest tightness. EXAM: CHEST  2 VIEW COMPARISON:  01/07/2010 FINDINGS: Lung volumes are low. The cardiomediastinal contours are unchanged with heart at the upper limits normal in size. There is vascular congestion without overt pulmonary edema. A retrocardiac hiatal hernia is seen. No confluent airspace disease, pleural effusion or pneumothorax. No acute osseous abnormalities are seen. IMPRESSION: 1. Stable borderline cardiomegaly. Vascular congestion without overt edema. 2. Hiatal hernia. Electronically Signed   By: Jeb Levering M.D.   On: 11/12/2014 19:56   Ct Abdomen Pelvis W Contrast  11/12/2014  CLINICAL DATA:  Acute onset of bilateral leg swelling. Difficulty urinating. Initial encounter. EXAM: CT ABDOMEN AND PELVIS WITH CONTRAST TECHNIQUE: Multidetector CT imaging of the abdomen and pelvis was performed using the standard protocol following bolus administration of intravenous contrast. CONTRAST:  135mL OMNIPAQUE IOHEXOL 300 MG/ML  SOLN COMPARISON:  MRI of the abdomen performed 12/20/2009 FINDINGS: The visualized lung bases are clear. 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 Smaller splenic varices are noted. There is a 6.0 cm mildly heterogeneous hyperattenuating mass at the inferior tip of the liver. There is a 4.6 cm mildly hypoattenuating mass more superiorly within the right hepatic lobe. There is a vague 5.8 cm region of decreased attenuation within the right hepatic dome. Numerous scattered cystic foci are seen about the hepatic hilum and within the left hepatic lobe. There is also a smaller mildly hyperattenuating 2.1 cm mass in the periphery of the right hepatic lobe. These are all new from 2011, and most of the masses could reflect malignancy. Dynamic liver protocol MRI would be helpful for further evaluation, if malignancy is not yet diagnosed. The spleen is enlarged, measuring 15.5 cm in length. Changes of hepatic cirrhosis are  again noted. 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 Trace ascites is noted tracking about the liver and along the paracolic gutters. The patient is status post cholecystectomy, with clips noted at the gallbladder fossa. The pancreas and adrenal glands are grossly unremarkable in appearance. The liver and spleen are unremarkable in appearance. The gallbladder is within normal limits. The pancreas and adrenal glands are unremarkable. The kidneys are unremarkable in appearance. There is no evidence of hydronephrosis. No renal or ureteral stones are seen.  No perinephric stranding is appreciated. The small bowel is unremarkable in appearance. The stomach is within normal limits. No acute vascular abnormalities are seen. The appendix is grossly unremarkable in appearance, though difficult to fully characterize. There is no evidence for appendicitis. The colon is partially filled with stool and is unremarkable in appearance. Diffuse hypervascularity is noted about the sigmoid colon and rectum. The bladder is mildly distended and grossly unremarkable. The prostate remains normal in size. Vague soft tissue density at the right inguinal region is thought to be postoperative in nature, though an incompletely descended testis could have a similar appearance. No inguinal lymphadenopathy is seen. No acute osseous abnormalities are identified. IMPRESSION: 1. Multiple masses noted within the liver, both hyperattenuating and hypoattenuating in appearance, measuring up to 6.0 cm. Each of the larger masses could reflect malignancy, and is new from 2011. Dynamic liver protocol MRI would be helpful for further evaluation, if malignancy has not yet been diagnosed. 2. Underlying hepatic cirrhosis, with trace ascites. 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 Smaller splenic varices noted. 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 3. Splenomegaly noted. 4. Diffuse hypervascularity  noted about the sigmoid colon and rectum, of uncertain significance. 5. Vague soft tissue density at the right inguinal region is thought to be postoperative in nature, though an incompletely descended testis could have a similar appearance. Electronically Signed   By: Garald Balding M.D.   On: 11/12/2014 23:16    EKG: Independently reviewed. 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 His renal function is okay. No history of congestive heart failure.   -will admit to tele bed -give 100 g of albumin by IV -check BNP -Lasix 80 mg bid by IV -Continue spironolactone  HCV/cirrhosis: His ammonia level is 81, but no signs of hepatic encephalopathy. -increased lactulose 20 g bid to 3 times a day  Liver mass: Most likely hepatoma. His sister states that they're aware of this issue, but not treatment needed due to hospice care status. -pain control: When necessary oxycodone and OxyContin   Hypokalemia: K= 2.9 on admission. - Repleted - Check Mg level  GERD: -Protonix  DM-II: Last A1c not on record, Patient is taking Levemir at home -will decrease Levemir dose from 30-20 units daily -SSI -Check A1c  Protein-calorie malnutrition, severe ([place]): -Ensure  Difficulty urinating: Bladder scan did not show retention. No symptoms of UTI. -Check a urinalysis   DVT ppx: SQ Heparin     Code Status: DNR Family Communication: Yes, patient's sister at bed  side Disposition Plan: Admit to inpatient   Date of Service 11/13/2014    Ivor Costa [facility] Pager [phone]  If 7PM-7AM, please contact night-coverage [email or web address] Password [facility] 11/13/2014, 4:17 AM

## 2014-11-12 NOTE — ED Notes (Addendum)
EKG given to EDP,Jacubowitz,MD. For review.

## 2014-11-12 NOTE — ED Notes (Signed)
Per EMS, patient is from [facility].  Patient complains of bilaterally leg swelling and unable to urinate.  Facility attempted to start foley 4 times today without any success.  Facility states his mental status is altered.    BP: 125/57 P: 90 R:20 99% on room air  T: 98.8

## 2014-11-13 ENCOUNTER — Encounter (HOSPITAL_COMMUNITY): Payer: Self-pay | Admitting: Internal Medicine

## 2014-11-13 ENCOUNTER — Inpatient Hospital Stay (HOSPITAL_COMMUNITY)

## 2014-11-13 DIAGNOSIS — K7290 Hepatic failure, unspecified without coma: Secondary | ICD-10-CM

## 2014-11-13 DIAGNOSIS — E119 Type 2 diabetes mellitus without complications: Secondary | ICD-10-CM | POA: Diagnosis present

## 2014-11-13 DIAGNOSIS — E43 Unspecified severe protein-calorie malnutrition: Secondary | ICD-10-CM | POA: Diagnosis not present

## 2014-11-13 DIAGNOSIS — Z91013 Allergy to seafood: Secondary | ICD-10-CM | POA: Diagnosis not present

## 2014-11-13 DIAGNOSIS — R160 Hepatomegaly, not elsewhere classified: Secondary | ICD-10-CM | POA: Diagnosis present

## 2014-11-13 DIAGNOSIS — C220 Liver cell carcinoma: Secondary | ICD-10-CM | POA: Diagnosis present

## 2014-11-13 DIAGNOSIS — Z886 Allergy status to analgesic agent status: Secondary | ICD-10-CM | POA: Diagnosis not present

## 2014-11-13 DIAGNOSIS — Z87891 Personal history of nicotine dependence: Secondary | ICD-10-CM | POA: Diagnosis not present

## 2014-11-13 DIAGNOSIS — E440 Moderate protein-calorie malnutrition: Secondary | ICD-10-CM | POA: Diagnosis not present

## 2014-11-13 DIAGNOSIS — Z66 Do not resuscitate: Secondary | ICD-10-CM | POA: Diagnosis present

## 2014-11-13 DIAGNOSIS — Z79891 Long term (current) use of opiate analgesic: Secondary | ICD-10-CM | POA: Diagnosis not present

## 2014-11-13 DIAGNOSIS — R339 Retention of urine, unspecified: Secondary | ICD-10-CM | POA: Diagnosis present

## 2014-11-13 DIAGNOSIS — K7460 Unspecified cirrhosis of liver: Secondary | ICD-10-CM

## 2014-11-13 DIAGNOSIS — K219 Gastro-esophageal reflux disease without esophagitis: Secondary | ICD-10-CM | POA: Diagnosis present

## 2014-11-13 DIAGNOSIS — E876 Hypokalemia: Secondary | ICD-10-CM | POA: Diagnosis present

## 2014-11-13 DIAGNOSIS — M7989 Other specified soft tissue disorders: Secondary | ICD-10-CM

## 2014-11-13 DIAGNOSIS — Z6823 Body mass index (BMI) 23.0-23.9, adult: Secondary | ICD-10-CM | POA: Diagnosis not present

## 2014-11-13 DIAGNOSIS — Z515 Encounter for palliative care: Secondary | ICD-10-CM

## 2014-11-13 DIAGNOSIS — R601 Generalized edema: Secondary | ICD-10-CM | POA: Diagnosis present

## 2014-11-13 DIAGNOSIS — Z8249 Family history of ischemic heart disease and other diseases of the circulatory system: Secondary | ICD-10-CM | POA: Diagnosis not present

## 2014-11-13 DIAGNOSIS — K922 Gastrointestinal hemorrhage, unspecified: Secondary | ICD-10-CM | POA: Insufficient documentation

## 2014-11-13 DIAGNOSIS — R39198 Other difficulties with micturition: Secondary | ICD-10-CM | POA: Diagnosis present

## 2014-11-13 DIAGNOSIS — T502X5A Adverse effect of carbonic-anhydrase inhibitors, benzothiadiazides and other diuretics, initial encounter: Secondary | ICD-10-CM | POA: Diagnosis present

## 2014-11-13 DIAGNOSIS — B1920 Unspecified viral hepatitis C without hepatic coma: Secondary | ICD-10-CM | POA: Diagnosis present

## 2014-11-13 DIAGNOSIS — K7030 Alcoholic cirrhosis of liver without ascites: Secondary | ICD-10-CM | POA: Diagnosis not present

## 2014-11-13 DIAGNOSIS — Z79899 Other long term (current) drug therapy: Secondary | ICD-10-CM | POA: Diagnosis not present

## 2014-11-13 LAB — COMPREHENSIVE METABOLIC PANEL
ALBUMIN: 2.5 g/dL — AB (ref 3.5–5.0)
ALK PHOS: 144 U/L — AB (ref 38–126)
ALT: 35 U/L (ref 17–63)
AST: 88 U/L — AB (ref 15–41)
Anion gap: 10 (ref 5–15)
BILIRUBIN TOTAL: 2.7 mg/dL — AB (ref 0.3–1.2)
BUN: 7 mg/dL (ref 6–20)
CALCIUM: 8.7 mg/dL — AB (ref 8.9–10.3)
CO2: 29 mmol/L (ref 22–32)
CREATININE: 1.06 mg/dL (ref 0.61–1.24)
Chloride: 101 mmol/L (ref 101–111)
GFR calc Af Amer: >60 mL/min (ref >60)
GLUCOSE: 165 mg/dL — AB (ref 65–99)
Potassium: 3.2 mmol/L — ABNORMAL LOW (ref 3.5–5.1)
Sodium: 140 mmol/L (ref 135–145)
TOTAL PROTEIN: 7.6 g/dL (ref 6.5–8.1)

## 2014-11-13 LAB — GLUCOSE, CAPILLARY
GLUCOSE-CAPILLARY: 151 mg/dL — AB (ref 65–99)
GLUCOSE-CAPILLARY: 128 mg/dL — AB (ref 65–99)
GLUCOSE-CAPILLARY: 90 mg/dL (ref 65–99)
Glucose-Capillary: 138 mg/dL — ABNORMAL HIGH (ref 65–99)

## 2014-11-13 LAB — BRAIN NATRIURETIC PEPTIDE: B Natriuretic Peptide: 171.9 pg/mL — ABNORMAL HIGH (ref 0.0–100.0)

## 2014-11-13 LAB — URINALYSIS, ROUTINE W REFLEX MICROSCOPIC
BILIRUBIN URINE: NEGATIVE
Glucose, UA: NEGATIVE mg/dL
Ketones, ur: NEGATIVE mg/dL
Leukocytes, UA: NEGATIVE
Nitrite: NEGATIVE
PROTEIN: NEGATIVE mg/dL
Specific Gravity, Urine: 1.037 — ABNORMAL HIGH (ref 1.005–1.030)
UROBILINOGEN UA: 1 mg/dL (ref 0.0–1.0)
pH: 7 (ref 5.0–8.0)

## 2014-11-13 LAB — CBC
HEMATOCRIT: 31.6 % — AB (ref 39.0–52.0)
Hemoglobin: 10.3 g/dL — ABNORMAL LOW (ref 13.0–17.0)
MCH: 27.5 pg (ref 26.0–34.0)
MCHC: 32.6 g/dL (ref 30.0–36.0)
MCV: 84.3 fL (ref 78.0–100.0)
PLATELETS: 140 10*3/uL — AB (ref 150–400)
RBC: 3.75 MIL/uL — ABNORMAL LOW (ref 4.22–5.81)
RDW: 16 % — AB (ref 11.5–15.5)
WBC: 3.4 10*3/uL — AB (ref 4.0–10.5)

## 2014-11-13 LAB — MRSA PCR SCREENING: MRSA BY PCR: NEGATIVE

## 2014-11-13 LAB — URINE MICROSCOPIC-ADD ON

## 2014-11-13 LAB — MAGNESIUM: MAGNESIUM: 1.6 mg/dL — AB (ref 1.7–2.4)

## 2014-11-13 MED ORDER — FUROSEMIDE 10 MG/ML IJ SOLN
80.0000 mg | Freq: Two times a day (BID) | INTRAMUSCULAR | Status: DC
  Administered 2014-11-13 – 2014-11-15 (×5): 80 mg via INTRAVENOUS
  Filled 2014-11-13 (×5): qty 8

## 2014-11-13 MED ORDER — ONDANSETRON HCL 4 MG/2ML IJ SOLN
4.0000 mg | Freq: Four times a day (QID) | INTRAMUSCULAR | Status: DC | PRN
Start: 2014-11-13 — End: 2014-11-15

## 2014-11-13 MED ORDER — POTASSIUM CHLORIDE 20 MEQ/15ML (10%) PO SOLN
40.0000 meq | Freq: Once | ORAL | Status: AC
  Administered 2014-11-13: 40 meq via ORAL
  Filled 2014-11-13: qty 30

## 2014-11-13 MED ORDER — ALBUMIN HUMAN 25 % IV SOLN
100.0000 g | Freq: Once | INTRAVENOUS | Status: AC
  Administered 2014-11-13: 100 g via INTRAVENOUS
  Filled 2014-11-13: qty 400

## 2014-11-13 MED ORDER — OXYCODONE HCL 5 MG PO TABS
15.0000 mg | ORAL_TABLET | ORAL | Status: DC | PRN
  Administered 2014-11-13: 15 mg via ORAL
  Filled 2014-11-13: qty 3

## 2014-11-13 MED ORDER — SENNA 8.6 MG PO TABS
2.0000 | ORAL_TABLET | Freq: Two times a day (BID) | ORAL | Status: DC
  Administered 2014-11-13 – 2014-11-15 (×5): 17.2 mg via ORAL
  Filled 2014-11-13 (×5): qty 2

## 2014-11-13 MED ORDER — LACTULOSE 10 GM/15ML PO SOLN: 20.0000 g | Freq: Two times a day (BID) | ORAL | Status: DC

## 2014-11-13 MED ORDER — OXYCODONE HCL ER 20 MG PO T12A
20.0000 mg | EXTENDED_RELEASE_TABLET | Freq: Two times a day (BID) | ORAL | Status: DC
  Administered 2014-11-13 – 2014-11-15 (×6): 20 mg via ORAL
  Filled 2014-11-13 (×6): qty 1

## 2014-11-13 MED ORDER — DOCUSATE SODIUM 100 MG PO CAPS
100.0000 mg | ORAL_CAPSULE | Freq: Every day | ORAL | Status: DC
  Administered 2014-11-13 – 2014-11-15 (×3): 100 mg via ORAL
  Filled 2014-11-13 (×3): qty 1

## 2014-11-13 MED ORDER — ENSURE ENLIVE PO LIQD
237.0000 mL | Freq: Every day | ORAL | Status: DC
  Administered 2014-11-13 – 2014-11-15 (×3): 237 mL via ORAL

## 2014-11-13 MED ORDER — PANTOPRAZOLE SODIUM 20 MG PO TBEC
20.0000 mg | DELAYED_RELEASE_TABLET | Freq: Every day | ORAL | Status: DC
  Administered 2014-11-13 – 2014-11-15 (×3): 20 mg via ORAL
  Filled 2014-11-13 (×3): qty 1

## 2014-11-13 MED ORDER — ONDANSETRON HCL 4 MG PO TABS
4.0000 mg | ORAL_TABLET | Freq: Four times a day (QID) | ORAL | Status: DC | PRN
Start: 2014-11-13 — End: 2014-11-15

## 2014-11-13 MED ORDER — HEPARIN SODIUM (PORCINE) 5000 UNIT/ML IJ SOLN
5000.0000 [IU] | Freq: Three times a day (TID) | INTRAMUSCULAR | Status: DC
  Administered 2014-11-13 – 2014-11-15 (×8): 5000 [IU] via SUBCUTANEOUS
  Filled 2014-11-13 (×8): qty 1

## 2014-11-13 MED ORDER — LORAZEPAM 0.5 MG PO TABS
0.5000 mg | ORAL_TABLET | ORAL | Status: DC | PRN
Start: 2014-11-13 — End: 2014-11-15
  Administered 2014-11-13 – 2014-11-14 (×8): 0.5 mg via ORAL
  Filled 2014-11-13 (×9): qty 1

## 2014-11-13 MED ORDER — INSULIN ASPART 100 UNIT/ML ~~LOC~~ SOLN
0.0000 [IU] | Freq: Three times a day (TID) | SUBCUTANEOUS | Status: DC
  Administered 2014-11-13: 2 [IU] via SUBCUTANEOUS
  Administered 2014-11-13 (×2): 1 [IU] via SUBCUTANEOUS
  Administered 2014-11-14: 2 [IU] via SUBCUTANEOUS
  Administered 2014-11-14: 1 [IU] via SUBCUTANEOUS
  Administered 2014-11-15: 3 [IU] via SUBCUTANEOUS

## 2014-11-13 MED ORDER — ALUM & MAG HYDROXIDE-SIMETH 200-200-20 MG/5ML PO SUSP: 30.0000 mL | Freq: Four times a day (QID) | ORAL | Status: DC | PRN

## 2014-11-13 MED ORDER — INSULIN DETEMIR 100 UNIT/ML ~~LOC~~ SOLN
20.0000 [IU] | Freq: Every day | SUBCUTANEOUS | Status: DC
  Administered 2014-11-13 – 2014-11-15 (×3): 20 [IU] via SUBCUTANEOUS
  Filled 2014-11-13 (×3): qty 0.2

## 2014-11-13 MED ORDER — SODIUM CHLORIDE 0.9 % IJ SOLN
3.0000 mL | Freq: Two times a day (BID) | INTRAMUSCULAR | Status: DC
  Administered 2014-11-13 – 2014-11-14 (×2): 3 mL via INTRAVENOUS

## 2014-11-13 MED ORDER — TAMSULOSIN HCL 0.4 MG PO CAPS
0.4000 mg | ORAL_CAPSULE | Freq: Every day | ORAL | Status: DC
  Administered 2014-11-13 – 2014-11-15 (×3): 0.4 mg via ORAL
  Filled 2014-11-13 (×3): qty 1

## 2014-11-13 MED ORDER — CHLORHEXIDINE GLUCONATE 0.12 % MT SOLN
30.0000 mL | Freq: Three times a day (TID) | OROMUCOSAL | Status: DC
  Administered 2014-11-13 – 2014-11-15 (×7): 30 mL via OROMUCOSAL
  Filled 2014-11-13 (×7): qty 30

## 2014-11-13 MED ORDER — GABAPENTIN 300 MG PO CAPS
300.0000 mg | ORAL_CAPSULE | Freq: Three times a day (TID) | ORAL | Status: DC
  Administered 2014-11-13 – 2014-11-15 (×7): 300 mg via ORAL
  Filled 2014-11-13 (×7): qty 1

## 2014-11-13 MED ORDER — SPIRONOLACTONE 50 MG PO TABS
50.0000 mg | ORAL_TABLET | Freq: Two times a day (BID) | ORAL | Status: DC
  Administered 2014-11-13 – 2014-11-15 (×6): 50 mg via ORAL
  Filled 2014-11-13 (×7): qty 1

## 2014-11-13 MED ORDER — LACTULOSE 10 GM/15ML PO SOLN
20.0000 g | Freq: Three times a day (TID) | ORAL | Status: DC
  Administered 2014-11-13 – 2014-11-15 (×7): 20 g via ORAL
  Filled 2014-11-13 (×7): qty 30

## 2014-11-13 MED ORDER — BACLOFEN 5 MG HALF TABLET
5.0000 mg | ORAL_TABLET | Freq: Three times a day (TID) | ORAL | Status: DC
  Administered 2014-11-13 – 2014-11-15 (×7): 5 mg via ORAL
  Filled 2014-11-13 (×10): qty 1

## 2014-11-13 NOTE — Progress Notes (Signed)
*  PRELIMINARY RESULTS* Vascular Ultrasound Lower extremity venous duplex has been completed.  Preliminary findings: No evidence of DVT or baker's cyst.   Landry Mellow, RDMS, RVT  11/13/2014, 8:44 AM

## 2014-11-13 NOTE — Evaluation (Signed)
Physical Therapy Evaluation Patient Details Name: Jesse Delacruz MRN: [id number] DOB: 02/06/1960 Today's Date: 11/13/2014   History of Present Illness  Patient is a 54 year old male with past medical history of hepatitis C, alcoholic cirrhosis and likely hepatocellular carcinoma on hospice care admitted on 10/21 for worsening lower extremity swelling   Clinical Impression  Pt admitted with above diagnosis. Pt currently with functional limitations due to the deficits listed below (see PT Problem List).  Pt will benefit from skilled PT to increase their independence and safety with mobility to allow discharge to the venue listed below.  Recommend return to SNF     Follow Up Recommendations SNF    Equipment Recommendations  None recommended by PT    Recommendations for Other Services       Precautions / Restrictions Precautions Precautions: Fall      Mobility  Bed Mobility Overal bed mobility: Needs Assistance Bed Mobility: Sit to Supine       Sit to supine: Min assist   General bed mobility comments: with LEs  Transfers Overall transfer level: Needs assistance Equipment used: Rolling walker (2 wheeled) Transfers: Sit to/from Stand Sit to Stand: Min assist         General transfer comment: cues for hand placement, incr time  Ambulation/Gait Ambulation/Gait assistance: Min assist Ambulation Distance (Feet): 12 Feet (x2) Assistive device: Rolling walker (2 wheeled) Gait Pattern/deviations: Step-through pattern;Decreased stride length;Trunk flexed;Drifts right/left     General Gait Details: assist for balance and RW direction; tactile and verbal cues for posture and safety  Stairs            Wheelchair Mobility    Modified Rankin (Stroke Patients Only)       Balance Overall balance assessment: Needs assistance           Standing balance-Leahy Scale: Poor                               Pertinent Vitals/Pain Pain Assessment:  0-10 Pain Score: 8  Pain Location: catheter site Pain Intervention(s): Monitored during session;Repositioned (RN notified)    Home Living Family/patient expects to be discharged to:: Skilled nursing facility                 Additional Comments: pt has been a [facility] since Dec ?2015    Prior Function Level of Independence: Independent with assistive device(s)         Comments: pt is not a good historian and is easily agitated by questions; reports he amb with RW at his baseline     Hand Dominance        Extremity/Trunk Assessment   Upper Extremity Assessment: Overall WFL for tasks assessed           Lower Extremity Assessment: Overall WFL for tasks assessed;Generalized weakness (LEs edematous)         Communication   Communication: No difficulties  Cognition Arousal/Alertness: Awake/alert Behavior During Therapy: WFL for tasks assessed/performed Overall Cognitive Status: No family/caregiver present to determine baseline cognitive functioning Area of Impairment: Following commands;Problem solving       Following Commands: Follows one step commands with increased time     Problem Solving: Requires verbal cues;Difficulty sequencing      General Comments      Exercises        Assessment/Plan    PT Assessment Patient needs continued PT services  PT Diagnosis Difficulty walking   PT Problem  List Decreased activity tolerance;Decreased balance;Decreased mobility  PT Treatment Interventions DME instruction;Gait training;Functional mobility training;Therapeutic activities;Therapeutic exercise;Patient/family education;Balance training   PT Goals (Current goals can be found in the Care Plan section) Acute Rehab PT Goals Patient Stated Goal: none stated PT Goal Formulation: With patient Time For Goal Achievement: 11/20/14 Potential to Achieve Goals: Good    Frequency Min 3X/week   Barriers to discharge        Co-evaluation                End of Session Equipment Utilized During Treatment: Gait belt Activity Tolerance: Patient limited by pain Patient left: in bed;with call bell/phone within reach;with bed alarm set           Time: 7711-6579 PT Time Calculation (min) (ACUTE ONLY): 29 min   Charges:   PT Evaluation $Initial PT Evaluation Tier I: 1 Procedure PT Treatments $Gait Training: 8-22 mins   PT G Codes:        Solomia Harrell Dec 03, 2014, 3:59 PM

## 2014-11-13 NOTE — Clinical Social Work Note (Signed)
Clinical Social Work Assessment  Patient Details  Name: Jesse Delacruz MRN: [id number] Date of Birth: January 10, 1961  Date of referral:  11/13/14               Reason for consult:  Discharge Planning                Permission sought to share information with:  Family Supports Permission granted to share information::     Name::     Delores Majekodunmi  Agency::     Relationship::  sister  Contact Information:  [phone]  Housing/Transportation Living arrangements for the past 2 months:  [place] of Information:  Patient Patient Interpreter Needed:  None Criminal Activity/Legal Involvement Pertinent to Current Situation/Hospitalization:  No - Comment as needed Significant Relationships:  Siblings Lives with:  Facility Resident Do you feel safe going back to the place where you live?  Yes Need for family participation in patient care:  No (Coment)  Care giving concerns:  Pt admitted from [facility] where pt was followed by [facility]osp[facility] and [place].    Social Worker assessment / plan:  CSW received referral that pt admitted from [facility].  CSW met with pt at bedside. CSW introduced self and explained role. Pt expressed that he was uncomfortable and that RN is helping him get more comfortable due a "situation" Pt confirmed that he is a resident at [facility]. Pt reports that he has been at facility since December. Pt confirmed that [facility] follow [facility] at SNF. CSW inquired if pt plans to return when medically ready for discharge and pt confirmed that he will return to [facility].   CSW completed FL2 and sent clinical information to [facility].  CSW to continue to follow to provide support and assist with pt return to [facility] when medically ready for discharge.  Employment status:  Unemployed Forensic scientist:   Medicaid In [place] PT Recommendations:  [place] / Referral to community resources:  [place]  Patient/Family's Response to care:  Pt alert and oriented x 4. Pt expressing discomfort, but states RN has addressed with pt and helped pt to get more comfortable. Pt plans to return to [facility].  Patient/Family's Understanding of and Emotional Response to Diagnosis, Current Treatment, and Prognosis:  Pt currently uncomfortable, but RN addressing this need. Pt hopeful to get some rest now that he is starting to feel more comfortable. Pt aware that he is likely to remain in the hospital a few days before return to [facility].  Emotional Assessment Appearance:  Appears stated age Attitude/Demeanor/Rapport:  Other (appropriate) Affect (typically observed):  Appropriate Orientation:  Oriented to Self, Oriented to Place, Oriented to  Time, Oriented to Situation Alcohol / Substance use:  Not Applicable Psych involvement (Current and /or in the community):  No (Comment)  Discharge Needs  Concerns to be addressed:  Discharge Planning Concerns Readmission within the last 30 days:  No Current discharge risk:  None Barriers to Discharge:  Continued Medical Work up   Ladell Pier, LCSW 11/13/2014, 4:25 PM  [phone]

## 2014-11-13 NOTE — Progress Notes (Signed)
OT Cancellation Note  Patient Details Name: Jesse Delacruz MRN: [id number] DOB: [birth year]   Cancelled Treatment:    Reason Eval/Treat Not Completed: Other (comment).  Pt is a resident of [facility] and under hospice services.  Will sign off in acute; OT at SNF can screen for change in function  SPENCER,MARYELLEN 11/13/2014, 4:08 PM  Lesle Chris, OTR/L [phone] 11/13/2014

## 2014-11-13 NOTE — Progress Notes (Signed)
PROGRESS NOTE  Jesse Delacruz BMW:[id number] DOB: 1960-02-28 DOA: 11/12/2014 PCP: No primary care provider on file.  HPI/Recap of past 24 hours: Patient is a 54 year old male with past medical history of hepatitis C, alcoholic cirrhosis and likely hepatocellular carcinoma on hospice care admitted on 10/21 for worsening lower extremity swelling after being he has been off of his Lasix for a few days. Patient also reports some difficulty urinating. In the emergency room, patient noted to have ammonia level of 81, albumin of 2.1 and 3+ pitting edema. He was started on IV diuretics.  In less than 24 hours, patient has diuresed over 3 L. Still complains of some urinary difficulty  Assessment/Plan: Principal Problem:   Anasarca: Diuresis plus IV albumin. 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 : On rifaximin, lactulose   Gastroesophageal reflux disease without esophagitis   Hypokalemia   Protein-calorie malnutrition, severe ([place]): Nutrition to see   Code Status: DO NOT RESUSCITATE   Family Communication: Left message with sister   Disposition Plan: discharge in next few days once diuresed    Consultants:  None  Procedures:  None  Antibiotics:  None   Objective: BP 112/62 mmHg  Pulse 90  Temp(Src) 98.6 F (37 C) (Oral)  Resp 18  Ht 5\' 7"  (1.702 m)  Wt 74 kg (163 lb 2.3 oz)  BMI 25.55 kg/m2  SpO2 96%  Intake/Output Summary (Last 24 hours) at 11/13/14 1534 Last data filed at 11/13/14 1300  Gross per 24 hour  Intake    905 ml  Output   4010 ml  Net  -3105 ml   Filed Weights   11/12/14 1901 11/13/14 0150  Weight: 70.308 kg (155 lb) 74 kg (163 lb 2.3 oz)     Exam:   General:  oriented 2, no acute distress  Cardiovascular: regular rate and rhythm, S1 and S2  Respiratory: clear to auscultation bilaterally  Abdomen: soft, nontender, nondistended, hypoactive bowel sounds  Musculoskeletal: 3+ pitting edema bilaterally   Data Reviewed: Basic Metabolic Panel:  Recent Labs Lab 11/12/14 2003 11/13/14 0530  NA 137 140  K 2.9* 3.2*  CL 98* 101  CO2 30 29  GLUCOSE 149* 165*  BUN 8 7  CREATININE 1.11 1.06  CALCIUM 8.9 8.7*  MG  --  1.6*   Liver Function Tests:  Recent Labs Lab 11/12/14 2003 11/13/14 0530  AST 121* 88*  ALT 46 35  ALKPHOS 196* 144*  BILITOT 3.3* 2.7*  PROT 9.1* 7.6  ALBUMIN 2.1* 2.5*   No results for input(s): LIPASE, AMYLASE in the last 168 hours.  Recent Labs Lab 11/12/14 2003  AMMONIA 81*   CBC:  Recent Labs Lab 11/12/14 2003 11/13/14 0530  WBC 4.7 3.4*  NEUTROABS 3.1  --   HGB 12.5* 10.3*  HCT 37.7* 31.6*  MCV 84.9 84.3  PLT 164 140*   Cardiac Enzymes:   No results for input(s): CKTOTAL, CKMB, CKMBINDEX, TROPONINI in the last 168 hours. BNP (last 3 results)  Recent Labs  11/13/14 0530  BNP 171.9*    ProBNP (last 3 results) No results for input(s): PROBNP in the last 8760 hours.  CBG:  Recent Labs Lab 11/13/14 0836 11/13/14 1212  GLUCAP 138* 151*  Recent Results (from the past 240 hour(s))  MRSA PCR Screening     Status: None   Collection Time: 11/13/14  1:39 AM  Result Value Ref Range Status   MRSA by PCR NEGATIVE NEGATIVE Final    Comment:        The GeneXpert MRSA Assay (FDA approved for NASAL specimens only), is one component of a comprehensive MRSA colonization surveillance program. It is not intended to diagnose MRSA infection nor to guide or monitor treatment for MRSA infections.      Studies: Dg Chest 2 View  11/12/2014  CLINICAL DATA:  Leg swelling, abdominal distention, and upper chest tightness. EXAM: CHEST  2 VIEW COMPARISON:  01/07/2010  FINDINGS: Lung volumes are low. The cardiomediastinal contours are unchanged with heart at the upper limits normal in size. There is vascular congestion without overt pulmonary edema. A retrocardiac hiatal hernia is seen. No confluent airspace disease, pleural effusion or pneumothorax. No acute osseous abnormalities are seen. IMPRESSION: 1. Stable borderline cardiomegaly. Vascular congestion without overt edema. 2. Hiatal hernia. Electronically Signed   By: Jeb Levering M.D.   On: 11/12/2014 19:56   Ct Abdomen Pelvis W Contrast  11/12/2014  CLINICAL DATA:  Acute onset of bilateral leg swelling. Difficulty urinating. Initial encounter. EXAM: CT ABDOMEN AND PELVIS WITH CONTRAST TECHNIQUE: Multidetector CT imaging of the abdomen and pelvis was performed using the standard protocol following bolus administration of intravenous contrast. CONTRAST:  149mL OMNIPAQUE IOHEXOL 300 MG/ML  SOLN COMPARISON:  MRI of the abdomen performed 12/20/2009 FINDINGS: The visualized lung bases are clear. 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 Smaller splenic varices are noted. There is a 6.0 cm mildly heterogeneous hyperattenuating mass at the inferior tip of the liver. There is a 4.6 cm mildly hypoattenuating mass more superiorly within the right hepatic lobe. There is a vague 5.8 cm region of decreased attenuation within the right hepatic dome. Numerous scattered cystic foci are seen about the hepatic hilum and within the left hepatic lobe. There is also a smaller mildly hyperattenuating 2.1 cm mass in the periphery of the right hepatic lobe. These are all new from 2011, and most of the masses could reflect malignancy. Dynamic liver protocol MRI would be helpful for further evaluation, if malignancy is not yet diagnosed. The spleen is enlarged, measuring 15.5 cm in length. Changes of hepatic cirrhosis are again noted. 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 Trace ascites is  noted tracking about the liver and along the paracolic gutters. The patient is status post cholecystectomy, with clips noted at the gallbladder fossa. The pancreas and adrenal glands are grossly unremarkable in appearance. The liver and spleen are unremarkable in appearance. The gallbladder is within normal limits. The pancreas and adrenal glands are unremarkable. The kidneys are unremarkable in appearance. There is no evidence of hydronephrosis. No renal or ureteral stones are seen. No perinephric stranding is appreciated. The small bowel is unremarkable in appearance. The stomach is within normal limits. No acute vascular abnormalities are seen. The appendix is grossly unremarkable in appearance, though difficult to fully characterize. There is no evidence for appendicitis. The colon is partially filled with stool and is unremarkable in appearance. Diffuse hypervascularity is noted about the sigmoid colon and rectum. The bladder is mildly distended and grossly unremarkable. The prostate remains normal in size. Vague soft tissue density at the right inguinal region is thought to be postoperative in nature, though an incompletely descended testis could have a  similar appearance. No inguinal lymphadenopathy is seen. No acute osseous abnormalities are identified. IMPRESSION: 1. Multiple masses noted within the liver, both hyperattenuating and hypoattenuating in appearance, measuring up to 6.0 cm. Each of the larger masses could reflect malignancy, and is new from 2011. Dynamic liver protocol MRI would be helpful for further evaluation, if malignancy has not yet been diagnosed. 2. Underlying hepatic cirrhosis, with trace ascites. 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 Smaller splenic varices noted. 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 3. Splenomegaly noted. 4. Diffuse hypervascularity noted about the sigmoid colon and rectum, of uncertain significance. 5. Vague soft tissue  density at the right inguinal region is thought to be postoperative in nature, though an incompletely descended testis could have a similar appearance. Electronically Signed   By: Garald Balding M.D.   On: 11/12/2014 23:16    Scheduled Meds: . baclofen  5 mg Oral TID  . chlorhexidine  30 mL Mouth/Throat TID  . docusate sodium  100 mg Oral Daily  . feeding supplement (ENSURE ENLIVE)  237 mL Oral Daily  . furosemide  80 mg Intravenous BID  . gabapentin  300 mg Oral TID  . heparin  5,000 Units Subcutaneous 3 times per day  . insulin aspart  0-9 Units Subcutaneous TID WC  . insulin detemir  20 Units Subcutaneous Daily  . lactulose  20 g Oral TID  . OxyCODONE  20 mg Oral Q12H  . pantoprazole  20 mg Oral Daily  . senna  2 tablet Oral BID  . sodium chloride  3 mL Intravenous Q12H  . spironolactone  50 mg Oral BID  . tamsulosin  0.4 mg Oral Daily    Continuous Infusions:    Time spent: 25 minutes  [place] [facility] Pager [phone]. If 7PM-7AM, please contact night-coverage at [email or web address], password [facility] 11/13/2014, 3:34 PM  LOS: 0 days

## 2014-11-13 NOTE — Progress Notes (Signed)
Jesse Delacruz-[facility] [place] [facility] [place] RN Visit-Stacie Carlye Grippe RN, BSN  This a related admission to his [facility] diagnosis of Hepatocellular Carcinoma, per [facility] MD, Dr. Wenda Low. Patient is a DNR.  Patient seen in room with sister at bedside.  Patient continues to verbalize desire to void although staff has multiple times reminded him he has a foley catheter in place. He has a breakfast tray in front of him and he is attempting to eat some breakfast.  He is on RA and respirations are WNL.  He has received one dose of 15 mg Oxycodone IR for pain, per chart review.  Offered emotional support and left contact information with sister.   [facility] updated medication list and transfer summary placed on chart.  Please call [facility] for any questions or concerns.  Thank you, Freddi Starr RN, [place] Liaison [phone]

## 2014-11-14 DIAGNOSIS — K7030 Alcoholic cirrhosis of liver without ascites: Principal | ICD-10-CM

## 2014-11-14 LAB — GLUCOSE, CAPILLARY
Glucose-Capillary: 75 mg/dL (ref 65–99)
Glucose-Capillary: 151 mg/dL — ABNORMAL HIGH (ref 65–99)
Glucose-Capillary: 125 mg/dL — ABNORMAL HIGH (ref 65–99)
Glucose-Capillary: 107 mg/dL — ABNORMAL HIGH (ref 65–99)

## 2014-11-14 LAB — AMMONIA: Ammonia: 76 umol/L — ABNORMAL HIGH (ref 9–35)

## 2014-11-14 LAB — PSA: PSA: 0.22 ng/mL (ref 0.00–4.00)

## 2014-11-14 NOTE — Progress Notes (Signed)
Jesse Delacruz of [place]-[facility] RN Visit-Lisa Strandberg RN  This a related admission to his [facility] diagnosis of Hepatocellular Carcinoma, per [facility] MD, Dr. Wenda Low. Patient is a DNR.patient lying on his left side dozing when writer entered room. Pt. State he does not know when he is being discharged and states " I want to sleep." No family present in the room. Spoke with staff RN, Sonia Side who reports pt. has been getting Ativan regularly PO. He has had 5 doses in 24 hours. She is unaware of any discharge plan for today. Pt. Continues to try and get out of bed and continues to be reminded he has a foley catheter in place.  [facility] updated medication list and transfer summary placed on chart.  Please call [facility] for any questions or concerns.   [facility] Liaison [phone]

## 2014-11-14 NOTE — Progress Notes (Signed)
PROGRESS NOTE  Jesse Delacruz EGB:[id number] DOB: 04/11/1960 DOA: 11/12/2014 PCP: No primary care provider on file.  HPI/Recap of past 24 hours: Patient is a 54 year old male with past medical history of hepatitis C, alcoholic cirrhosis and likely hepatocellular carcinoma on hospice care admitted on 10/21 for worsening lower extremity swelling after being he has been off of his Lasix for a few days. Patient also reports some difficulty urinating. In the emergency room, patient noted to have ammonia level of 81, albumin of 2.1 and 3+ pitting edema. He was started on IV diuretics.  Since admission, patient has responded well and diuresed over 7.5 L. He has no complaints and no pain. Wants to sleep.  Assessment/Plan: Principal Problem:   Anasarca: Diuresis plus IV albumin. Responding well, clinically improved Active Problems:   Hepatitis C   Hospice care patient   Difficulty in urination: We'll try Flomax   Liver mass: Likely hepatocellular carcinoma, no plan for workup given hospice   Diabetes mellitus without complication [facility]): Blood sugars monitored, stable   Cirrhosis of liver without ascites ([place]) with secondary hepatic encephalopathy : On rifaximin, lactulose. Check follow-up ammonia level   Gastroesophageal reflux disease without esophagitis   Hypokalemia   Protein-calorie malnutrition, severe ([place]): Nutrition to see   Code Status: DO NOT RESUSCITATE   Family Communication: Unable to leave message with sister  Disposition Plan: Anticipate discharge tomorrow   Consultants:  None  Procedures:  None  Antibiotics:  None   Objective: BP 109/74 mmHg  Pulse 87  Temp(Src) 98.6 F (37 C) (Oral)  Resp 18  Ht 5\' 7"  (1.702 m)  Wt 73 kg (160 lb 15 oz)  BMI 25.20 kg/m2  SpO2 98%  Intake/Output Summary (Last 24 hours) at 11/14/14 1425 Last data filed at 11/14/14 1349  Gross per 24 hour  Intake    360 ml  Output   4825 ml  Net  -4465 ml   Filed Weights   11/12/14 1901 11/13/14 0150 11/14/14 0442  Weight: 70.308 kg (155 lb) 74 kg (163 lb 2.3 oz) 73 kg (160 lb 15 oz)    Exam:   General:  Somnolent, no acute distress  Cardiovascular: regular rate and rhythm, S1 and S2  Respiratory: clear to auscultation bilaterally  Abdomen: soft, nontender, nondistended, hypoactive bowel sounds  Musculoskeletal: 3+ pitting edema bilaterally   Data Reviewed: Basic Metabolic Panel:  Recent Labs Lab 11/12/14 2003 11/13/14 0530  NA 137 140  K 2.9* 3.2*  CL 98* 101  CO2 30 29  GLUCOSE 149* 165*  BUN 8 7  CREATININE 1.11 1.06  CALCIUM 8.9 8.7*  MG  --  1.6*   Liver Function Tests:  Recent Labs Lab 11/12/14 2003 11/13/14 0530  AST 121* 88*  ALT 46 35  ALKPHOS 196* 144*  BILITOT 3.3* 2.7*  PROT 9.1* 7.6  ALBUMIN 2.1* 2.5*   No results for input(s): LIPASE, AMYLASE in the last 168 hours.  Recent Labs Lab 11/12/14 2003  AMMONIA 81*   CBC:  Recent Labs Lab 11/12/14 2003 11/13/14 0530  WBC 4.7 3.4*  NEUTROABS 3.1  --   HGB 12.5* 10.3*  HCT 37.7* 31.6*  MCV 84.9 84.3  PLT 164 140*   Cardiac Enzymes:   No results for input(s): CKTOTAL, CKMB, CKMBINDEX, TROPONINI in the last 168 hours. BNP (last 3 results)  Recent Labs  11/13/14 0530  BNP 171.9*    ProBNP (last 3 results) No results for input(s): PROBNP in the last 8760 hours.  CBG:  Recent Labs Lab 11/13/14 1212 11/13/14 1642 11/13/14 2116 11/14/14 0745 11/14/14 1158  GLUCAP 151* 128* 90 75 151*    Recent Results (from the past 240 hour(s))  MRSA PCR Screening     Status: None   Collection Time: 11/13/14  1:39 AM  Result Value Ref Range Status   MRSA by PCR NEGATIVE NEGATIVE Final    Comment:        The GeneXpert MRSA Assay (FDA approved for NASAL specimens only), is one component of a comprehensive MRSA colonization surveillance program. It is not intended to diagnose MRSA infection nor to guide or monitor treatment for MRSA infections.        Studies: No results found.  Scheduled Meds: . baclofen  5 mg Oral TID  . chlorhexidine  30 mL Mouth/Throat TID  . docusate sodium  100 mg Oral Daily  . feeding supplement (ENSURE ENLIVE)  237 mL Oral Daily  . furosemide  80 mg Intravenous BID  . gabapentin  300 mg Oral TID  . heparin  5,000 Units Subcutaneous 3 times per day  . insulin aspart  0-9 Units Subcutaneous TID WC  . insulin detemir  20 Units Subcutaneous Daily  . lactulose  20 g Oral TID  . OxyCODONE  20 mg Oral Q12H  . pantoprazole  20 mg Oral Daily  . senna  2 tablet Oral BID  . sodium chloride  3 mL Intravenous Q12H  . spironolactone  50 mg Oral BID  . tamsulosin  0.4 mg Oral Daily    Continuous Infusions:    Time spent: 15 minutes  [place] [facility] Pager [phone]. If 7PM-7AM, please contact night-coverage at [email or web address], password [facility] 11/14/2014, 2:25 PM  LOS: 1 day

## 2014-11-15 DIAGNOSIS — E440 Moderate protein-calorie malnutrition: Secondary | ICD-10-CM | POA: Insufficient documentation

## 2014-11-15 LAB — BASIC METABOLIC PANEL
Anion gap: 9 (ref 5–15)
BUN: 11 mg/dL (ref 6–20)
CALCIUM: 9 mg/dL (ref 8.9–10.3)
CHLORIDE: 97 mmol/L — AB (ref 101–111)
CO2: 30 mmol/L (ref 22–32)
CREATININE: 1.11 mg/dL (ref 0.61–1.24)
GFR calc non Af Amer: >60 mL/min (ref >60)
GLUCOSE: 119 mg/dL — AB (ref 65–99)
Potassium: 3.1 mmol/L — ABNORMAL LOW (ref 3.5–5.1)
Sodium: 136 mmol/L (ref 135–145)

## 2014-11-15 LAB — GLUCOSE, CAPILLARY
Glucose-Capillary: 118 mg/dL — ABNORMAL HIGH (ref 65–99)
Glucose-Capillary: 201 mg/dL — ABNORMAL HIGH (ref 65–99)

## 2014-11-15 LAB — HEMOGLOBIN A1C
Hgb A1c MFr Bld: 7.7 % — ABNORMAL HIGH (ref 4.8–5.6)
MEAN PLASMA GLUCOSE: 174 mg/dL

## 2014-11-15 MED ORDER — OXYCODONE HCL ER 20 MG PO T12A: 20.0000 mg | EXTENDED_RELEASE_TABLET | Freq: Two times a day (BID) | ORAL | Status: AC

## 2014-11-15 MED ORDER — LORAZEPAM 1 MG PO TABS: 0.5000 mg | ORAL_TABLET | ORAL | Status: AC | PRN

## 2014-11-15 MED ORDER — POTASSIUM CHLORIDE CRYS ER 20 MEQ PO TBCR
40.0000 meq | EXTENDED_RELEASE_TABLET | Freq: Two times a day (BID) | ORAL | Status: DC
  Administered 2014-11-15: 40 meq via ORAL
  Filled 2014-11-15: qty 2

## 2014-11-15 MED ORDER — TAMSULOSIN HCL 0.4 MG PO CAPS: 0.4000 mg | ORAL_CAPSULE | Freq: Every day | ORAL | Status: AC

## 2014-11-15 MED ORDER — LACTULOSE 10 GM/15ML PO SOLN: 30.0000 g | Freq: Two times a day (BID) | ORAL | Status: AC

## 2014-11-15 MED ORDER — OXYCODONE HCL 15 MG PO TABS: 15.0000 mg | ORAL_TABLET | ORAL | Status: AC | PRN

## 2014-11-15 MED ORDER — FUROSEMIDE 40 MG PO TABS: 80.0000 mg | ORAL_TABLET | Freq: Two times a day (BID) | ORAL | Status: AC

## 2014-11-15 MED ORDER — OXYCODONE HCL ER 30 MG PO T12A: 1.0000 | EXTENDED_RELEASE_TABLET | Freq: Two times a day (BID) | ORAL | Status: AC

## 2014-11-15 MED ORDER — INSULIN DETEMIR 100 UNIT/ML ~~LOC~~ SOLN: 20.0000 [IU] | Freq: Every day | SUBCUTANEOUS | Status: AC

## 2014-11-15 MED ORDER — POTASSIUM CHLORIDE ER 20 MEQ PO TBCR: 20.0000 meq | EXTENDED_RELEASE_TABLET | Freq: Every day | ORAL | Status: AC

## 2014-11-15 NOTE — Progress Notes (Signed)
[facility]-[facility] [place] [facility] [place] RN Visit-Stacie Carlye Grippe RN, BSN  This a related admission to his [facility] diagnosis of Hepatocellular Carcinoma, per [facility] MD, Dr. Wenda Low. Patient is a DNR. Patient seen in room, sitting up eating breakfast.  Patient alert, oriented.  He stated he is feeling much better.  He said he has a sister in [place] that is also in the hospital.  Per chart review, patient has received 4 doses of 0.5 mg Ativan po for anxiety. [facility] will continue to follow and anticipate any discharge needs.  Please call [facility] for any questions or concerns.  Thank you, Freddi Starr RN, [facility] Liaison [phone]

## 2014-11-15 NOTE — Progress Notes (Signed)
Initial Nutrition Assessment  DOCUMENTATION CODES:   Non-severe (moderate) malnutrition in context of chronic illness  INTERVENTION:  - Will order Ensure Enlive po BID, each supplement provides 350 kcal and 20 grams of protein - RD will continue to monitor for needs  NUTRITION DIAGNOSIS:   Increased nutrient needs related to catabolic illness, cancer and cancer related treatments as evidenced by estimated needs.  GOAL:   Patient will meet greater than or equal to 90% of their needs  MONITOR:   PO intake, Supplement acceptance, Weight trends, Labs, Skin, I & O's  REASON FOR ASSESSMENT:   Consult Assessment of nutrition requirement/status  ASSESSMENT:   54 y.o. male with PMH of diabetes mellitus, GERD, HCV, liver cirrhosis, GI bleeding, hospice care, liver mass, who presents with bilateral leg swelling, difficulty urination.  Pt seen for MST and consult. BMI indicates overweight status but is likely inaccurate given severe edema. Per chart review, pt ate 100% breakfast and lunch 10/22 and 80% breakfast and 100% lunch yesterday (10/24).  Pt states he has a very good appetite; tech helping pt with lunch meal selections at time of RD visit. He states he had a good appetite PTA with no recent fluctuations. He states UBW of 125 lbs but states he has been gaining weight recently. Moderate muscle and fat wasting noted to upper body; did not assess legs due to severe edema which would likely mask muscle wasting.  Pt likely meeting minimal needs; will order Ensure to supplement. Medications reviewed. Labs reviewed; CBGs: 75-151 mg/dL, K: 3.1 mmol/L, Cl: 97 mmol/L.   Diet Order:  Diet heart healthy/carb modified Room service appropriate?: Yes; Fluid consistency:: Thin Diet - low sodium heart healthy  Skin:  Reviewed, no issues  Last BM:  10/24  Height:   Ht Readings from Last 1 Encounters:  11/13/14 5\' 7"  (1.702 m)    Weight:   Wt Readings from Last 1 Encounters:  11/15/14  155 lb 6.8 oz (70.5 kg)    Ideal Body Weight:  67.27 kg (kg)  BMI:  Body mass index is 24.34 kg/(m^2).  Estimated Nutritional Needs:   Kcal:  2125-2325  Protein:  80-90 grams  Fluid:  1.7 L/day  EDUCATION NEEDS:   No education needs identified at this time     Jarome Matin, RD, LDN Inpatient Clinical Dietitian Pager # [phone] After hours/weekend pager # [phone]

## 2014-11-15 NOTE — Progress Notes (Signed)
Patient for d/c today to SNF bed at  [facility] as pta. Patient agreeable to this plan- attempting to reach family- Will plan transfer via EMS. Eduard Clos, MSW, [place]

## 2014-11-15 NOTE — Discharge Summary (Signed)
Discharge Summary  Jesse Delacruz OYD:[id number] DOB: 03/31/1960  PCP: No primary care provider on file.  Admit date: 11/12/2014 Discharge date: 11/15/2014  Time spent: 25 minutes  Recommendations for Outpatient Follow-up:  1. Patient returning back to his skilled nursing facility with hospice  2. New medication: K Dur 20 mEq by mouth daily 3. Medication change: Lasix increased to 80 mg by mouth twice a day 4. New medication: Flomax or 0.4 mg by mouth daily 5. Medication change: Levemir decreased to 20 units subcutaneous daily  Discharge Diagnoses:  Active Hospital Problems   Diagnosis Date Noted  . Anasarca 11/13/2014  . Malnutrition of moderate degree 11/15/2014  . Hospice care patient 11/13/2014  . Difficulty in urination 11/13/2014  . Liver mass 11/13/2014  . Protein-calorie malnutrition, severe ([name]) 11/13/2014  . Hepatitis C   . Diabetes mellitus without complication ([name])   . Cirrhosis of liver without ascites ([place])   . Gastroesophageal reflux disease without esophagitis   . Hypokalemia     Resolved Hospital Problems   Diagnosis Date Noted Date Resolved  No resolved problems to display.    Discharge Condition: Improved  Diet recommendation: Low-sodium with ensure enlive  daily   Filed Weights   11/13/14 0150 11/14/14 0442 11/15/14 0505  Weight: 74 kg (163 lb 2.3 oz) 73 kg (160 lb 15 oz) 70.5 kg (155 lb 6.8 oz)    History of present illness:  Patient is a 54 year old male with past medical history of hepatitis C, alcoholic cirrhosis and likely hepatocellular carcinoma on hospice care admitted on 10/21 for worsening lower extremity swelling after being he has been off of his Lasix for a few days. Patient also reports some difficulty urinating. In the emergency room, patient noted to have ammonia level of 81, albumin of 2.1 and 3+ pitting edema. He was started on IV diuretics  Hospital Course:  Principal Problem:   Anasarca: Patient responded well to albumin  plus Lasix and diuresed over 10 L. By 10/24, doing much better and he was also more alert. Active Problems:   Cirrhosis of the liver without ascites/hepatitis C/liver mass possibly hepatocellular carcinoma with secondary hepatic encephalopathy: On hospice. Underlying cause of anasarca. Patient will resume hospice care services.  Patient resumed on lactulose. Much more mentating clearly by day of discharge   Hospice care patient   Difficulty in urination: Suspect BPH. Responded well to Flomax. We'll continue    Diabetes mellitus without complication [facility]): Previously on Levemir 30 units subcutaneous daily. A1c at 7.7. Here he was only on 20 units and sugar stayed well-controlled no higher than 150. Will decrease to 20 units and CBGs will be followed at facility. If he starts to have elevated CBGs, increase Levemir accordingly    Gastroesophageal reflux disease without esophagitis   Hypokalemia: Secondary to diuresis. Replaced.    Malnutrition of moderate degree: Patient meets criteria in the context of chronic illness. Seen by nutrition. Continued on ensure feeding supplementation   Procedures:  None   Consultations:  None   Discharge Exam: BP 120/78 mmHg  Pulse 81  Temp(Src) 98.4 F (36.9 C) (Oral)  Resp 22  Ht 5\' 7"  (1.702 m)  Wt 70.5 kg (155 lb 6.8 oz)  BMI 24.34 kg/m2  SpO2 100%  General: Alert and oriented 2, no acute distress  Cardiovascular: Regular rate and rhythm, E7-M0  Respiratory: Click to auscultation bilaterally   Discharge Instructions You were cared for by a hospitalist during your hospital stay. If you have any questions about  your discharge medications or the care you received while you were in the hospital after you are discharged, you can call the unit and asked to speak with the hospitalist on call if the hospitalist that took care of you is not available. Once you are discharged, your primary care physician will handle any further medical issues. Please  note that NO REFILLS for any discharge medications will be authorized once you are discharged, as it is imperative that you return to your primary care physician (or establish a relationship with a primary care physician if you do not have one) for your aftercare needs so that they can reassess your need for medications and monitor your lab values.  Discharge Instructions    Diet - low sodium heart healthy    Complete by:  As directed      Increase activity slowly    Complete by:  As directed             Medication List    TAKE these medications        baclofen 10 MG tablet  Commonly known as:  LIORESAL  Take 5 mg by mouth 3 (three) times daily.     chlorhexidine 0.12 % solution  Commonly known as:  PERIDEX  Use as directed 30 mLs in the mouth or throat 3 (three) times daily. After brushing teeth     docusate sodium 100 MG capsule  Commonly known as:  COLACE  Take 100 mg by mouth daily.     ENSURE PLUS Liqd  Take 237 mLs by mouth daily.     furosemide 40 MG tablet  Commonly known as:  LASIX  Take 2 tablets (80 mg total) by mouth 2 (two) times daily.     gabapentin 300 MG capsule  Commonly known as:  NEURONTIN  Take 300 mg by mouth 3 (three) times daily.     insulin detemir 100 UNIT/ML injection  Commonly known as:  LEVEMIR  Inject 0.2 mLs (20 Units total) into the skin daily.     lactulose 10 GM/15ML solution  Commonly known as:  CHRONULAC  Take 45 mLs (30 g total) by mouth 2 (two) times daily.     LORazepam 1 MG tablet  Commonly known as:  ATIVAN  Take 0.5 tablets (0.5 mg total) by mouth every 4 (four) hours as needed for anxiety.     OxyCODONE 20 mg T12a 12 hr tablet  Commonly known as:  OXYCONTIN  Take 1 tablet (20 mg total) by mouth every 12 (twelve) hours. Take with 30 mg Oxycontin     oxyCODONE 15 MG immediate release tablet  Commonly known as:  ROXICODONE  Take 1 tablet (15 mg total) by mouth every 4 (four) hours as needed for pain.     OxyCODONE HCl  ER 30 MG T12a  Commonly known as:  OXYCONTIN  Take 1 tablet by mouth every 12 (twelve) hours. Take with 20 mg Oxycontin ER     pantoprazole 20 MG tablet  Commonly known as:  PROTONIX  Take 20 mg by mouth daily.     Potassium Chloride ER 20 MEQ Tbcr  Take 20 mEq by mouth daily.     senna 8.6 MG Tabs tablet  Commonly known as:  SENOKOT  Take 2 tablets by mouth 2 (two) times daily.     spironolactone 50 MG tablet  Commonly known as:  ALDACTONE  Take 50 mg by mouth 2 (two) times daily.     tamsulosin 0.4 MG  Caps capsule  Commonly known as:  FLOMAX  Take 1 capsule (0.4 mg total) by mouth daily.       Allergies  Allergen Reactions  . Aspirin     unknown  . Nsaids     GI bleed  . Shellfish Allergy     unknown      The results of significant diagnostics from this hospitalization (including imaging, microbiology, ancillary and laboratory) are listed below for reference.    Significant Diagnostic Studies: Dg Chest 2 View  11/12/2014  CLINICAL DATA:  Leg swelling, abdominal distention, and upper chest tightness. EXAM: CHEST  2 VIEW COMPARISON:  01/07/2010 FINDINGS: Lung volumes are low. The cardiomediastinal contours are unchanged with heart at the upper limits normal in size. There is vascular congestion without overt pulmonary edema. A retrocardiac hiatal hernia is seen. No confluent airspace disease, pleural effusion or pneumothorax. No acute osseous abnormalities are seen. IMPRESSION: 1. Stable borderline cardiomegaly. Vascular congestion without overt edema. 2. Hiatal hernia. Electronically Signed   By: Jeb Levering M.D.   On: 11/12/2014 19:56   Ct Abdomen Pelvis W Contrast  11/12/2014  CLINICAL DATA:  Acute onset of bilateral leg swelling. Difficulty urinating. Initial encounter. EXAM: CT ABDOMEN AND PELVIS WITH CONTRAST TECHNIQUE: Multidetector CT imaging of the abdomen and pelvis was performed using the standard protocol following bolus administration of intravenous  contrast. CONTRAST:  139mL OMNIPAQUE IOHEXOL 300 MG/ML  SOLN COMPARISON:  MRI of the abdomen performed 12/20/2009 FINDINGS: The visualized lung bases are clear. 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 Smaller splenic varices are noted. There is a 6.0 cm mildly heterogeneous hyperattenuating mass at the inferior tip of the liver. There is a 4.6 cm mildly hypoattenuating mass more superiorly within the right hepatic lobe. There is a vague 5.8 cm region of decreased attenuation within the right hepatic dome. Numerous scattered cystic foci are seen about the hepatic hilum and within the left hepatic lobe. There is also a smaller mildly hyperattenuating 2.1 cm mass in the periphery of the right hepatic lobe. These are all new from 2011, and most of the masses could reflect malignancy. Dynamic liver protocol MRI would be helpful for further evaluation, if malignancy is not yet diagnosed. The spleen is enlarged, measuring 15.5 cm in length. Changes of hepatic cirrhosis are again noted. 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 Trace ascites is noted tracking about the liver and along the paracolic gutters. The patient is status post cholecystectomy, with clips noted at the gallbladder fossa. The pancreas and adrenal glands are grossly unremarkable in appearance. The liver and spleen are unremarkable in appearance. The gallbladder is within normal limits. The pancreas and adrenal glands are unremarkable. The kidneys are unremarkable in appearance. There is no evidence of hydronephrosis. No renal or ureteral stones are seen. No perinephric stranding is appreciated. The small bowel is unremarkable in appearance. The stomach is within normal limits. No acute vascular abnormalities are seen. The appendix is grossly unremarkable in appearance, though difficult to fully characterize. There is no evidence for appendicitis. The colon is partially filled with stool and is  unremarkable in appearance. Diffuse hypervascularity is noted about the sigmoid colon and rectum. The bladder is mildly distended and grossly unremarkable. The prostate remains normal in size. Vague soft tissue density at the right inguinal region is thought to be postoperative in nature, though an incompletely descended testis could have a similar appearance. No inguinal lymphadenopathy is seen. No acute  osseous abnormalities are identified. IMPRESSION: 1. Multiple masses noted within the liver, both hyperattenuating and hypoattenuating in appearance, measuring up to 6.0 cm. Each of the larger masses could reflect malignancy, and is new from 2011. Dynamic liver protocol MRI would be helpful for further evaluation, if malignancy has not yet been diagnosed. 2. Underlying hepatic cirrhosis, with trace ascites. 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 Smaller splenic varices noted. 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 3. Splenomegaly noted. 4. Diffuse hypervascularity noted about the sigmoid colon and rectum, of uncertain significance. 5. Vague soft tissue density at the right inguinal region is thought to be postoperative in nature, though an incompletely descended testis could have a similar appearance. Electronically Signed   By: Garald Balding M.D.   On: 11/12/2014 23:16    Microbiology: Recent Results (from the past 240 hour(s))  MRSA PCR Screening     Status: None   Collection Time: 11/13/14  1:39 AM  Result Value Ref Range Status   MRSA by PCR NEGATIVE NEGATIVE Final    Comment:        The GeneXpert MRSA Assay (FDA approved for NASAL specimens only), is one component of a comprehensive MRSA colonization surveillance program. It is not intended to diagnose MRSA infection nor to guide or monitor treatment for MRSA infections.      Labs: Basic Metabolic Panel:  Recent Labs Lab 11/12/14 2003 11/13/14 0530 11/15/14 0525  NA 137 140 136  K 2.9*  3.2* 3.1*  CL 98* 101 97*  CO2 30 29 30   GLUCOSE 149* 165* 119*  BUN 8 7 11   CREATININE 1.11 1.06 1.11  CALCIUM 8.9 8.7* 9.0  MG  --  1.6*  --    Liver Function Tests:  Recent Labs Lab 11/12/14 2003 11/13/14 0530  AST 121* 88*  ALT 46 35  ALKPHOS 196* 144*  BILITOT 3.3* 2.7*  PROT 9.1* 7.6  ALBUMIN 2.1* 2.5*   No results for input(s): LIPASE, AMYLASE in the last 168 hours.  Recent Labs Lab 11/12/14 2003 11/14/14 1450  AMMONIA 81* 76*   CBC:  Recent Labs Lab 11/12/14 2003 11/13/14 0530  WBC 4.7 3.4*  NEUTROABS 3.1  --   HGB 12.5* 10.3*  HCT 37.7* 31.6*  MCV 84.9 84.3  PLT 164 140*   Cardiac Enzymes: No results for input(s): CKTOTAL, CKMB, CKMBINDEX, TROPONINI in the last 168 hours. BNP: BNP (last 3 results)  Recent Labs  11/13/14 0530  BNP 171.9*    ProBNP (last 3 results) No results for input(s): PROBNP in the last 8760 hours.  CBG:  Recent Labs Lab 11/14/14 1158 11/14/14 1638 11/14/14 2217 11/15/14 0811 11/15/14 1203  GLUCAP 151* 125* 107* 118* 201*       Signed:  Annita Brod  [facility] 11/15/2014, 12:43 PM

## 2014-11-15 NOTE — Progress Notes (Signed)
Pt had been DC and transport has been set up with [facility]. Pt is alert but having some moment of confusion. I took his foley at 1400, his skin is intact, VSS, no personal belongings are at the bedside, pain is controlled at this time, no s/s of distress or discomfort at this time. Pt going back to SNF that he came from before admission here.

## 2015-02-23 DEATH — deceased
